# Patient Record
Sex: Female | Born: 2000 | Race: White | Hispanic: No | Marital: Single | Smoking: Never smoker
Health system: Southern US, Community
[De-identification: ages and names within clinical notes are randomized; demographics above are authoritative.]

## PROBLEM LIST (undated history)

## (undated) DIAGNOSIS — F419 Anxiety disorder, unspecified: Secondary | ICD-10-CM

## (undated) DIAGNOSIS — N946 Dysmenorrhea, unspecified: Secondary | ICD-10-CM

## (undated) DIAGNOSIS — G43909 Migraine, unspecified, not intractable, without status migrainosus: Secondary | ICD-10-CM

## (undated) DIAGNOSIS — F32A Depression, unspecified: Secondary | ICD-10-CM

## (undated) DIAGNOSIS — Z23 Encounter for immunization: Secondary | ICD-10-CM

## (undated) DIAGNOSIS — F329 Major depressive disorder, single episode, unspecified: Secondary | ICD-10-CM

## (undated) DIAGNOSIS — R102 Pelvic and perineal pain: Secondary | ICD-10-CM

## (undated) DIAGNOSIS — K2970 Gastritis, unspecified, without bleeding: Secondary | ICD-10-CM

## (undated) HISTORY — DX: Encounter for immunization: Z23

## (undated) HISTORY — PX: CHOLECYSTECTOMY: SHX55

## (undated) HISTORY — DX: Gastritis, unspecified, without bleeding: K29.70

## (undated) HISTORY — DX: Pelvic and perineal pain: R10.2

## (undated) HISTORY — DX: Dysmenorrhea, unspecified: N94.6

---
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
## 2019-03-07 ENCOUNTER — Ambulatory Visit: Payer: Medicaid Other

## 2019-03-07 ENCOUNTER — Other Ambulatory Visit: Payer: Self-pay

## 2019-03-07 DIAGNOSIS — Z3202 Encounter for pregnancy test, result negative: Secondary | ICD-10-CM | POA: Diagnosis not present

## 2019-03-07 DIAGNOSIS — Z3042 Encounter for surveillance of injectable contraceptive: Secondary | ICD-10-CM

## 2019-03-07 LAB — POCT URINE PREGNANCY: Preg Test, Ur: NEGATIVE

## 2019-03-07 MED ORDER — MEDROXYPROGESTERONE ACETATE 150 MG/ML IM SUSP
150.0000 mg | Freq: Once | INTRAMUSCULAR | Status: AC
  Administered 2019-03-07: 15:00:00 150 mg via INTRAMUSCULAR

## 2019-05-30 ENCOUNTER — Other Ambulatory Visit: Payer: Self-pay

## 2019-05-30 ENCOUNTER — Ambulatory Visit (INDEPENDENT_AMBULATORY_CARE_PROVIDER_SITE_OTHER): Payer: BC Managed Care – PPO

## 2019-05-30 DIAGNOSIS — Z3042 Encounter for surveillance of injectable contraceptive: Secondary | ICD-10-CM

## 2019-05-30 MED ORDER — MEDROXYPROGESTERONE ACETATE 150 MG/ML IM SUSP
150.0000 mg | Freq: Once | INTRAMUSCULAR | Status: AC
  Administered 2019-05-30: 11:00:00 150 mg via INTRAMUSCULAR

## 2019-08-25 ENCOUNTER — Other Ambulatory Visit: Payer: Self-pay

## 2019-08-25 ENCOUNTER — Encounter: Payer: Self-pay | Admitting: Obstetrics and Gynecology

## 2019-08-25 ENCOUNTER — Ambulatory Visit: Payer: Self-pay | Admitting: Obstetrics and Gynecology

## 2019-08-25 ENCOUNTER — Other Ambulatory Visit (HOSPITAL_COMMUNITY)
Admission: RE | Admit: 2019-08-25 | Discharge: 2019-08-25 | Disposition: A | Discharge status: Home / Self Care | Payer: BC Managed Care – PPO | Source: Ambulatory Visit | Attending: Obstetrics and Gynecology | Admitting: Obstetrics and Gynecology

## 2019-08-25 ENCOUNTER — Ambulatory Visit (INDEPENDENT_AMBULATORY_CARE_PROVIDER_SITE_OTHER): Payer: Medicaid Other | Admitting: Obstetrics and Gynecology

## 2019-08-25 VITALS — BP 130/80 | Ht 61.0 in | Wt 197.0 lb

## 2019-08-25 DIAGNOSIS — Z113 Encounter for screening for infections with a predominantly sexual mode of transmission: Secondary | ICD-10-CM | POA: Insufficient documentation

## 2019-08-25 DIAGNOSIS — N941 Unspecified dyspareunia: Secondary | ICD-10-CM

## 2019-08-25 DIAGNOSIS — Z3042 Encounter for surveillance of injectable contraceptive: Secondary | ICD-10-CM

## 2019-08-25 DIAGNOSIS — Z01419 Encounter for gynecological examination (general) (routine) without abnormal findings: Secondary | ICD-10-CM | POA: Diagnosis not present

## 2019-08-25 DIAGNOSIS — G43019 Migraine without aura, intractable, without status migrainosus: Secondary | ICD-10-CM

## 2019-08-25 LAB — POCT URINE PREGNANCY: Preg Test, Ur: NEGATIVE

## 2019-08-25 MED ORDER — MEDROXYPROGESTERONE ACETATE 150 MG/ML IM SUSY: 150.0000 mg | PREFILLED_SYRINGE | Freq: Once | INTRAMUSCULAR | 3 refills | Status: AC

## 2019-08-25 MED ORDER — MEDROXYPROGESTERONE ACETATE 150 MG/ML IM SUSP
150.0000 mg | Freq: Once | INTRAMUSCULAR | Status: AC
  Administered 2019-08-25: 150 mg via INTRAMUSCULAR

## 2019-08-25 NOTE — Progress Notes (Signed)
PCP:  Galen Manila, NP (Inactive)   Chief Complaint  Patient presents with   Gynecologic Exam   Vaginal Pain    during intercourse     HPI:      [name] is a 19 y.o. G0P0000 who LMP was No LMP recorded. Patient has had an injection., presents today for her annual examination.  Her menses are absent with depo. Has occas BTB when next depo due.  Dysmenorrhea mild. Pt with hx of severe dysmen off BC. (Started depo a couple yrs ago due to hx of migraines. Depo improved cramps but worsened headaches. Pt saw [facility] neuro who suggested she go on OCPs. Pt started on aviane 12/18. Pt stated dysmen improved on OCPs but not as good as depo, but she had increased acne, emotional sx, and wanted non-daily method. Depo restarted 7/19. Headaches being managed by neuro, also treating for triggers, including light sensitivity.  Had migraine improvement with piercing.)  Sex activity: currently sex active, contraception - depo . Has been having pain at vaginal opening with dryness, tearing at post fourchette, and feeling like muscle at opening won't relax, even if using manual stimulation to stretch opening. Sx for a few months. Using a lot of water-based lubricant that doesn't last. Uses dove sens skin soap and dryer sheets. Has newer partner who is larger in size. Is on psych meds but they were changed to improve other sexual side effects. Can use tampons without a problem.  Last Pap: N/A Hx of STDs: none  There is no FH of breast cancer. There is no FH of ovarian cancer. The patient does do self-breast exams.  Tobacco use: The patient denies current or previous tobacco use. Alcohol use: occas No drug use. Exercise: moderately active.   She does get adequate calcium and Vitamin D in her diet. Gardasil completed.   Past Medical History:  Diagnosis Date   Anxiety    Depression    Dysmenorrhea in adolescent    Gastritis    Migraines    Pelvic pain    Vaccine for human  papilloma virus (HPV) types 6, 11, 16, and 18 administered     Past Surgical History:  Procedure Laterality Date   CHOLECYSTECTOMY      Family History  Problem Relation Age of Onset   Hypertension Mother    Diabetes Mother        prediabetic   Heart failure Father    Endometriosis Maternal Grandmother    Migraines Paternal Grandmother    Breast cancer Neg Hx    Ovarian cancer Neg Hx     Social History   Socioeconomic History   Marital status: Single    Spouse name: Not on file   Number of children: Not on file   Years of education: Not on file   Highest education level: Not on file  Occupational History   Not on file  Tobacco Use   Smoking status: Never Smoker   Smokeless tobacco: Never Used  Vaping Use   Vaping Use: Never used  Substance and Sexual Activity   Alcohol use: No   Drug use: Yes    Types: Marijuana   Sexual activity: Yes    Birth control/protection: Injection  Other Topics Concern   Not on file  Social History Narrative   Not on file   Social Determinants of Health   Financial Resource Strain:    Difficulty of Paying Living Expenses:   Food Insecurity:    Worried  About Running Out of Food in the Last Year:    Ran Out of Food in the Last Year:   Transportation Needs:    Freight forwarder (Medical):    Lack of Transportation (Non-Medical):   Physical Activity:    Days of Exercise per Week:    Minutes of Exercise per Session:   Stress:    Feeling of Stress :   Social Connections:    Frequency of Communication with Friends and Family:    Frequency of Social Gatherings with Friends and Family:    Attends Religious Services:    Active Member of Clubs or Organizations:    Attends Banker Meetings:    Marital Status:   Intimate Partner Violence:    Fear of Current or Ex-Partner:    Emotionally Abused:    Physically Abused:    Sexually Abused:     Outpatient Medications Prior to  Visit  Medication Sig Dispense Refill   acetaminophen (TYLENOL) 325 MG tablet Take 650 mg by mouth every 6 (six) hours as needed for mild pain or moderate pain.     clonazePAM (KLONOPIN) 0.25 MG disintegrating tablet Take by mouth.     ibuprofen (ADVIL,MOTRIN) 200 MG tablet Take 200 mg by mouth every 6 (six) hours as needed for mild pain or moderate pain.     LamoTRIgine 100 MG TB24 24 hour tablet Take by mouth.     LORazepam (ATIVAN) 1 MG tablet TK 1/2 TO 1 T PO TO ABORT PANIC ATTACK     QUEtiapine (SEROQUEL) 25 MG tablet Take by mouth.     Rimegepant Sulfate 75 MG TBDP Take by mouth.     SUMAtriptan (IMITREX) 50 MG tablet Take 1 tablet (50 mg total) by mouth once as needed for up to 1 dose for migraine. May repeat one dose in 2 hours if headache persists, for max dose 24 hours 12 tablet 2   Galcanezumab-gnlm (EMGALITY) 120 MG/ML SOAJ Inject into the skin. (Patient not taking: Reported on 08/25/2019)     medroxyPROGESTERone Acetate 150 MG/ML SUSY Inject 1 mL (150 mg total) into the muscle once for 1 dose. 1 mL 3   meloxicam (MOBIC) 7.5 MG tablet Take 7.5 mg by mouth daily.     omeprazole (PRILOSEC) 20 MG capsule Take by mouth.     No facility-administered medications prior to visit.      ROS:  Review of Systems  Constitutional: Negative for fatigue, fever and unexpected weight change.  Respiratory: Negative for cough, shortness of breath and wheezing.   Cardiovascular: Negative for chest pain, palpitations and leg swelling.  Gastrointestinal: Negative for blood in stool, constipation, diarrhea, nausea and vomiting.  Endocrine: Negative for cold intolerance, heat intolerance and polyuria.  Genitourinary: Positive for dyspareunia. Negative for dysuria, flank pain, frequency, genital sores, hematuria, menstrual problem, pelvic pain, urgency, vaginal bleeding, vaginal discharge and vaginal pain.  Musculoskeletal: Negative for back pain, joint swelling and myalgias.  Skin:  Negative for rash.  Neurological: Positive for headaches. Negative for dizziness, syncope, light-headedness and numbness.  Hematological: Negative for adenopathy.  Psychiatric/Behavioral: Negative for agitation, confusion, dysphoric mood, sleep disturbance and suicidal ideas. The patient is not nervous/anxious.   BREAST: No symptoms   Objective: BP 130/80    Ht 5\' 1"  (1.549 m)    Wt 197 lb (89.4 kg)    BMI 37.22 kg/m    Physical Exam Constitutional:      Appearance: She is well-developed.  Genitourinary:     Vulva,  vagina, cervix, uterus, right adnexa and left adnexa normal.     No vulval lesion or tenderness noted.     No vaginal discharge, erythema or tenderness.     No cervical polyp.     Uterus is not enlarged or tender.     No right or left adnexal mass present.     Right adnexa not tender.     Left adnexa not tender.     Genitourinary Comments: VAGINAL OPENING IS WNL, ABLE TO INSERT SPECULUM AND DO BIMANUAL EXAM EASILY; NO ERYTHEMA  Neck:     Thyroid: No thyromegaly.  Cardiovascular:     Rate and Rhythm: Normal rate and regular rhythm.     Heart sounds: Normal heart sounds. No murmur heard.   Pulmonary:     Effort: Pulmonary effort is normal.     Breath sounds: Normal breath sounds.  Chest:     Breasts:        Right: No mass, nipple discharge, skin change or tenderness.        Left: No mass, nipple discharge, skin change or tenderness.  Abdominal:     Palpations: Abdomen is soft.     Tenderness: There is no abdominal tenderness. There is no guarding.  Musculoskeletal:        General: Normal range of motion.     Cervical back: Normal range of motion.  Neurological:     General: No focal deficit present.     Mental Status: She is alert and oriented to person, place, and time.     Cranial Nerves: No cranial nerve deficit.  Skin:    General: Skin is warm and dry.  Psychiatric:        Mood and Affect: Mood normal.        Behavior: Behavior normal.        Thought  Content: Thought content normal.        Judgment: Judgment normal.  Vitals reviewed.     Assessment/Plan: Encounter for annual routine gynecological examination  Screening for STD (sexually transmitted disease) - Plan: Cervicovaginal ancillary only  Encounter for surveillance of injectable contraceptive - Plan: POCT urine pregnancy, medroxyPROGESTERone (DEPO-PROVERA) injection 150 mg; Depo RF eRxd. Cont ca/Vit D.  Dyspareunia in female--neg exam. Try coconut oil as lubricant. 1 sample prem vag crm to use ext QHS for 1-2 wks in case related to depo use. Line dry underwear.  F/u prn.   Intractable migraine without aura and without status migrainosus--followed by neuro   Meds ordered this encounter  Medications   medroxyPROGESTERone (DEPO-PROVERA) injection 150 mg   medroxyPROGESTERone Acetate 150 MG/ML SUSY    Sig: Inject 1 mL (150 mg total) into the muscle once for 1 dose.    Dispense:  1 mL    Refill:  3    Order Specific Question:   Supervising Provider    Answer:   Nadara Mustard [[id number]]             GYN counsel STD prevention, adequate intake of calcium and vitamin D, diet and exercise     F/U  Return in about 1 year (around 08/24/2020) for annual.  Freya Zobrist B. Vignesh Willert, PA-C 08/25/2019 4:29 PM

## 2019-08-25 NOTE — Patient Instructions (Signed)
I value your feedback and entrusting us with your care. If you get a  patient survey, I would appreciate you taking the time to let us know about your experience today. Thank you!  As of January 02, 2019, your lab results will be released to your MyChart immediately, before I even have a chance to see them. Please give me time to review them and contact you if there are any abnormalities. Thank you for your patience.  

## 2019-08-27 LAB — CERVICOVAGINAL ANCILLARY ONLY
Chlamydia: NEGATIVE
Comment: NEGATIVE
Comment: NORMAL
Neisseria Gonorrhea: NEGATIVE

## 2019-11-15 ENCOUNTER — Other Ambulatory Visit: Payer: Self-pay | Admitting: Obstetrics and Gynecology

## 2019-11-15 DIAGNOSIS — Z3042 Encounter for surveillance of injectable contraceptive: Secondary | ICD-10-CM

## 2019-11-20 ENCOUNTER — Ambulatory Visit (INDEPENDENT_AMBULATORY_CARE_PROVIDER_SITE_OTHER): Payer: BC Managed Care – PPO

## 2019-11-20 ENCOUNTER — Other Ambulatory Visit: Payer: Self-pay

## 2019-11-20 DIAGNOSIS — N946 Dysmenorrhea, unspecified: Secondary | ICD-10-CM | POA: Diagnosis not present

## 2019-11-20 MED ORDER — MEDROXYPROGESTERONE ACETATE 150 MG/ML IM SUSP
150.0000 mg | Freq: Once | INTRAMUSCULAR | Status: AC
  Administered 2019-11-20: 150 mg via INTRAMUSCULAR

## 2019-11-21 ENCOUNTER — Ambulatory Visit: Payer: Self-pay

## 2019-11-24 ENCOUNTER — Ambulatory Visit: Payer: Self-pay

## 2019-12-20 IMAGING — DX DG CHEST 2V
2 series · 2 of 2 positions shown · non-contrast
Comparison: None.

CLINICAL DATA: Cough for 1 month with fever and chills

EXAM:
CHEST - 2 VIEW

[chest pa]
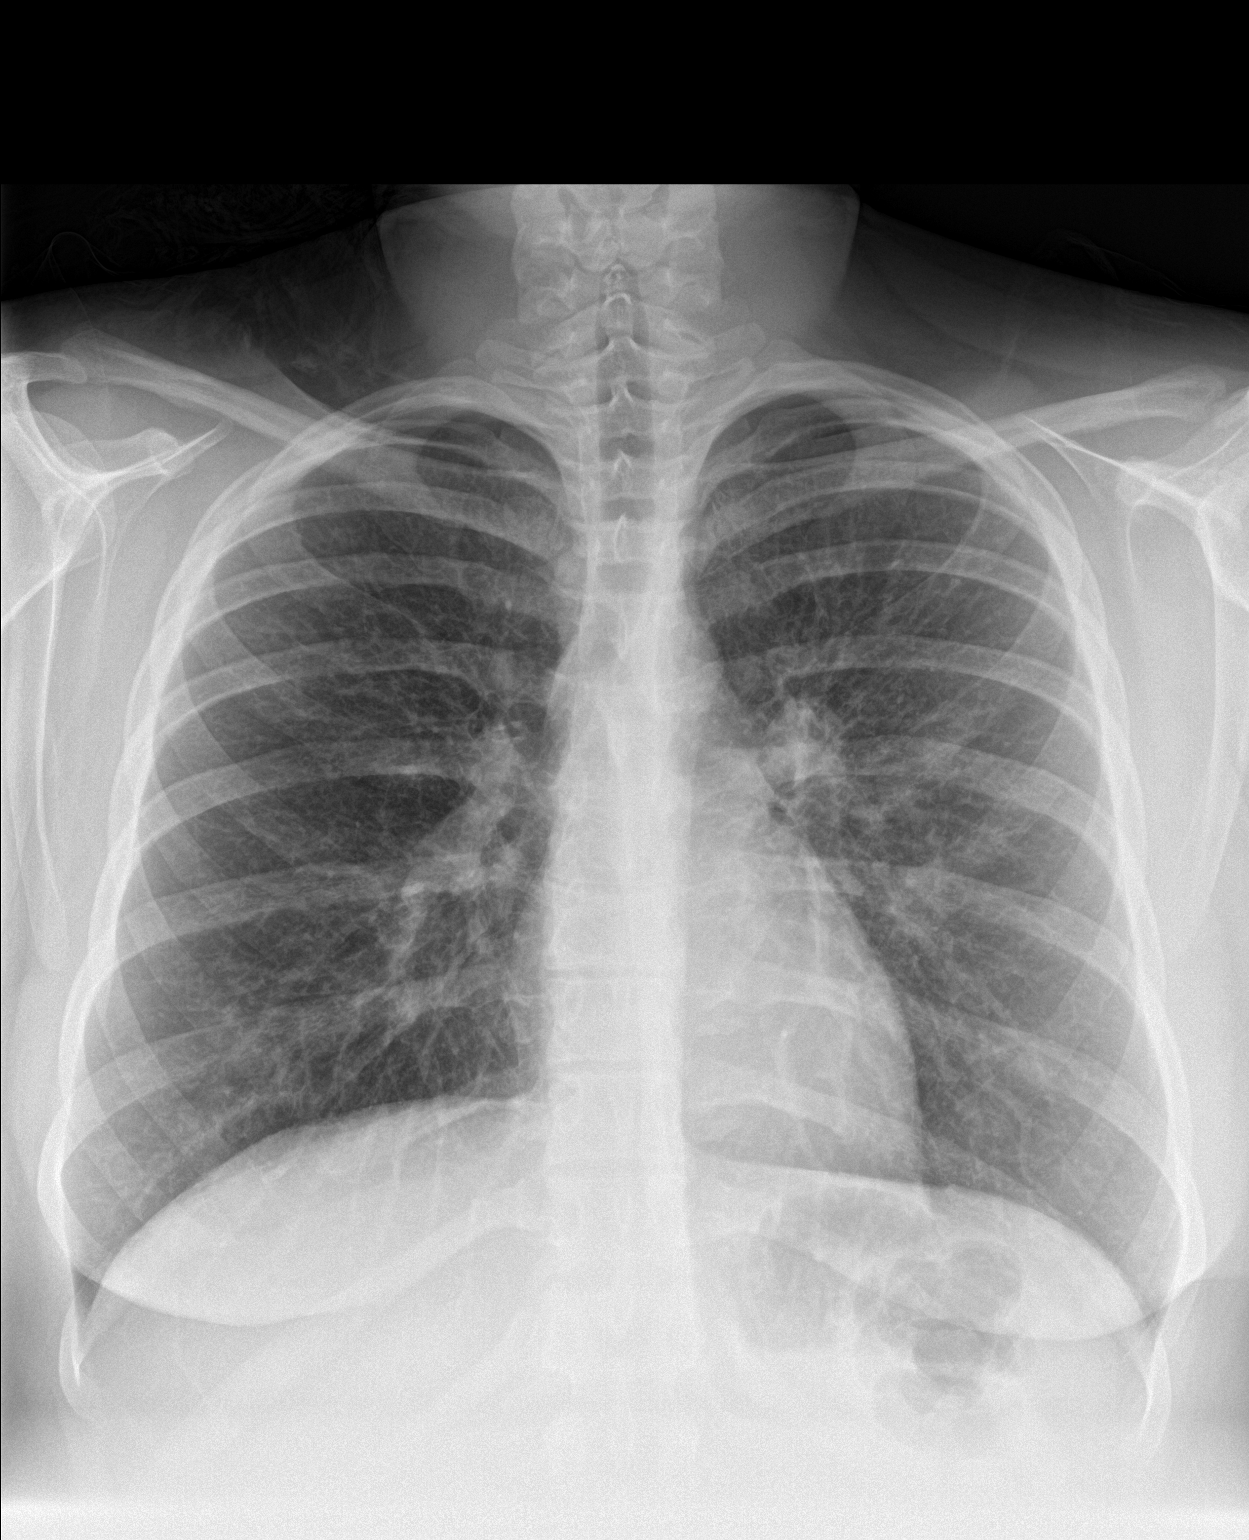

[chest lat]
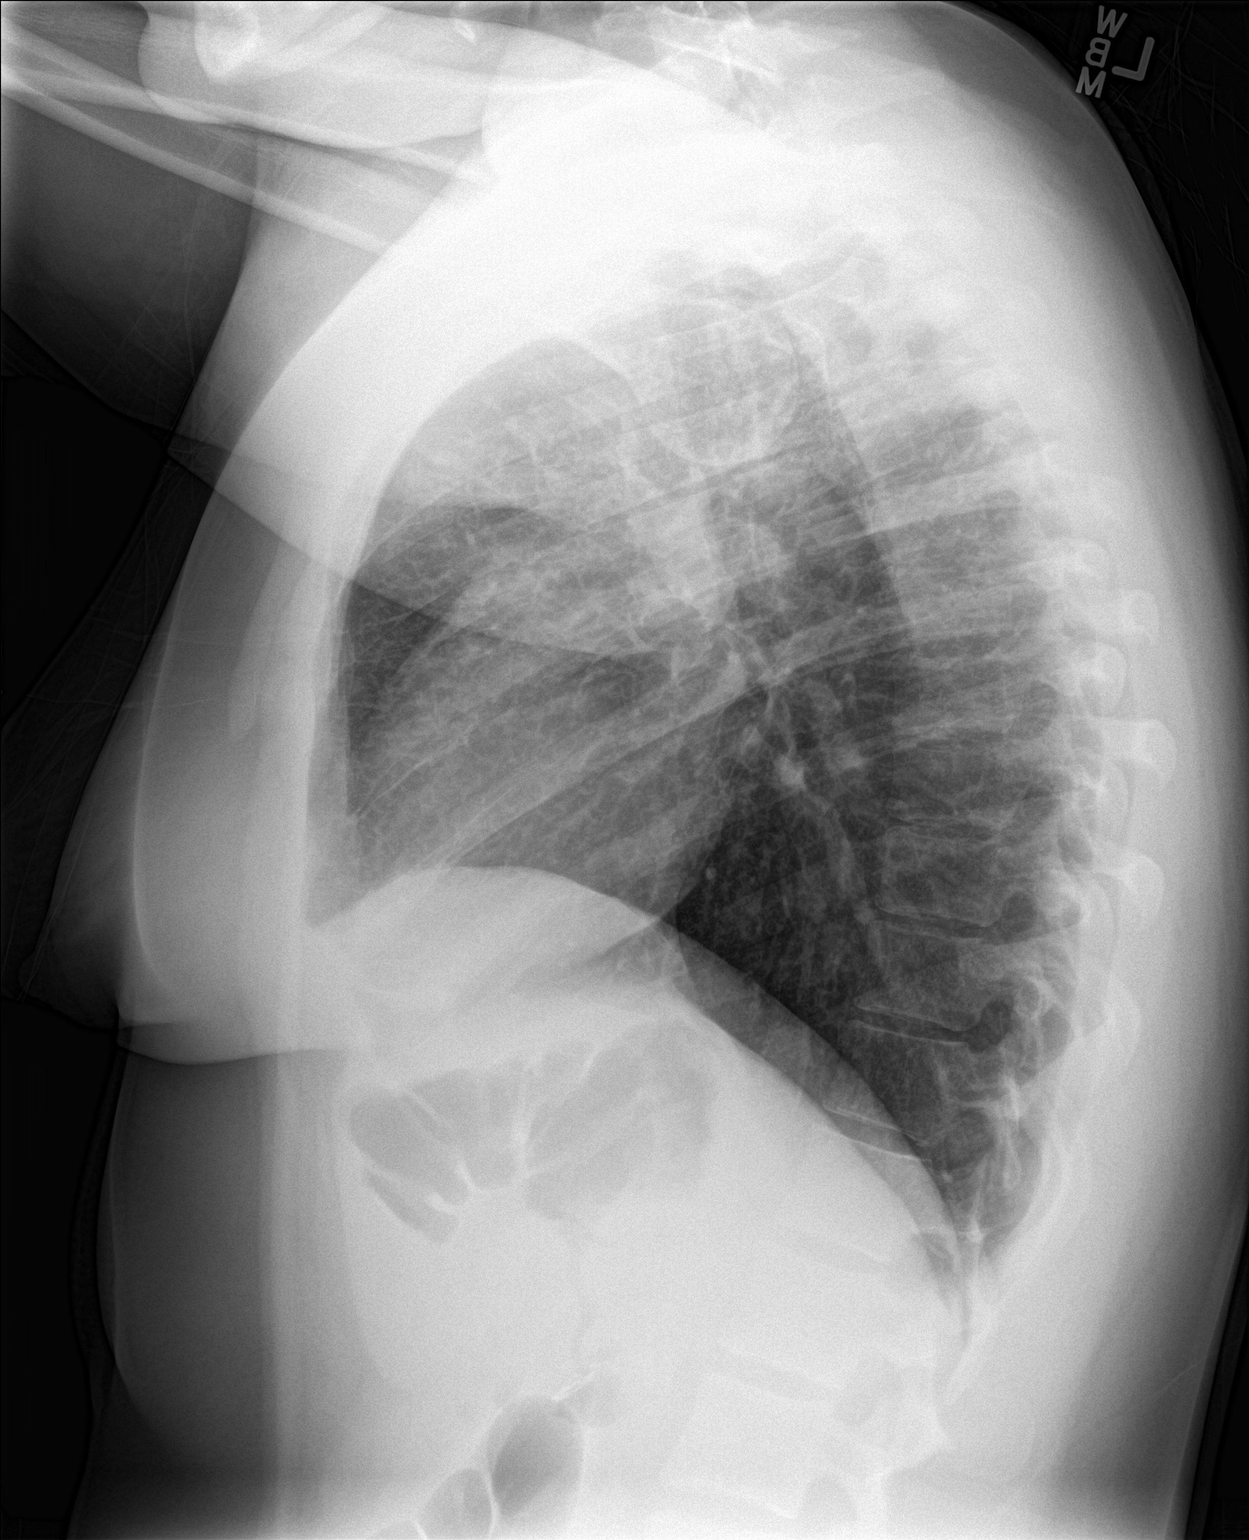

[2 of 2 positions shown; findings below may reference images not displayed]

FINDINGS: Asymmetric subtle increased markings about the left hilum. No
consolidation, edema, effusion, or pneumothorax. Normal heart size
and mediastinal contours.
IMPRESSION: Equivocal for left perihilar bronchopneumonia. Given the history,
note that atypical infection could have this subtle appearance.

## 2020-02-12 ENCOUNTER — Ambulatory Visit: Payer: BC Managed Care – PPO

## 2020-02-16 ENCOUNTER — Telehealth: Payer: Self-pay

## 2020-02-16 ENCOUNTER — Ambulatory Visit: Payer: BC Managed Care – PPO

## 2020-02-16 NOTE — Telephone Encounter (Signed)
Pt calling regarding depo.  [phone]  LMTC - msg muffled.

## 2020-02-23 NOTE — Telephone Encounter (Signed)
Left second msg to call.

## 2020-02-25 NOTE — Telephone Encounter (Signed)
Pt hasn't returned call.  Msg closed. 

## 2020-04-05 ENCOUNTER — Other Ambulatory Visit: Payer: Self-pay

## 2020-04-05 ENCOUNTER — Encounter (HOSPITAL_COMMUNITY): Payer: Self-pay

## 2020-04-05 ENCOUNTER — Ambulatory Visit (HOSPITAL_COMMUNITY)
Admission: EM | Admit: 2020-04-05 | Discharge: 2020-04-05 | Disposition: A | Discharge status: Home / Self Care | Payer: BC Managed Care – PPO | Attending: Physician Assistant | Admitting: Physician Assistant

## 2020-04-05 DIAGNOSIS — Z760 Encounter for issue of repeat prescription: Secondary | ICD-10-CM

## 2020-04-05 MED ORDER — LAMOTRIGINE 100 MG PO TABS: 100.0000 mg | ORAL_TABLET | Freq: Every day | ORAL | 0 refills | Status: AC

## 2020-04-05 MED ORDER — QUETIAPINE FUMARATE 25 MG PO TABS
25.0000 mg | ORAL_TABLET | Freq: Every day | ORAL | 0 refills | Status: AC
Start: 2020-04-05 — End: 2021-11-18

## 2020-04-05 MED ORDER — LAMOTRIGINE 200 MG PO TABS: 200.0000 mg | ORAL_TABLET | Freq: Every day | ORAL | 0 refills | Status: AC

## 2020-04-05 NOTE — Discharge Instructions (Signed)
Follow up with Behavioral health

## 2020-04-05 NOTE — ED Provider Notes (Signed)
[facility]    CSN: [id number] Arrival date & time: 04/05/20  1829      History   Chief Complaint Chief Complaint  Patient presents with  . Medication Refill    HPI Erin Joseph is a 20 y.o. female.   Pt presents for medication refill of lamotrigine, Seroquel, klonopin.  Reports she went to get her prescriptions and the pharmacy had difficulty refilling, stating there were no refills available.  She has reached out to prescribing provider and has been unable to reach them.  Pharmacy reports they haven't been able to get in touch with prescribing provider either.  She has been out of her Seroquel for 3 days.  Pt reports she is currently trying to establish care with a new behavioral health provider.      Past Medical History:  Diagnosis Date  . Anxiety   . Depression   . Dysmenorrhea in adolescent   . Gastritis   . Migraines   . Pelvic pain   . Vaccine for human papilloma virus (HPV) types 6, 11, 16, and 18 administered     Patient Active Problem List   Diagnosis Date Noted  . Dyspareunia in female 08/25/2019  . Migraines 07/31/2017  . Intractable migraine with aura without status migrainosus 09/19/2016  . Dysmenorrhea in the adolescent 09/19/2016  . Pelvic pain 09/19/2016    Past Surgical History:  Procedure Laterality Date  . CHOLECYSTECTOMY      OB History    Gravida  0   Para  0   Term  0   Preterm  0   AB  0   Living  0     SAB  0   IAB  0   Ectopic  0   Multiple  0   Live Births  0            Home Medications    Prior to Admission medications   Medication Sig Start Date End Date Taking? Authorizing Provider  lamoTRIgine (LAMICTAL) 100 MG tablet Take 1 tablet (100 mg total) by mouth daily. Take along with 200mg  tablet daily for a total of 300mg  04/05/20 05/05/20 Yes Tonianne Fine, 04/07/20, PA-C  lamoTRIgine (LAMICTAL) 200 MG tablet Take 1 tablet (200 mg total) by mouth daily. Take along with 100mg  tablet daily for a total of  300mg  04/05/20 05/05/20 Yes Divine Hansley, , PA-C  QUEtiapine (SEROQUEL) 25 MG tablet Take 1 tablet (25 mg total) by mouth at bedtime. 04/05/20 05/05/20 Yes My Madariaga, 05/07/20, PA-C  acetaminophen (TYLENOL) 325 MG tablet Take 650 mg by mouth every 6 (six) hours as needed for mild pain or moderate pain.    [provider]  clonazePAM (KLONOPIN) 0.25 MG disintegrating tablet Take by mouth.    [provider]  Galcanezumab-gnlm (EMGALITY) 120 MG/ML SOAJ Inject into the skin. Patient not taking: Reported on 08/25/2019 03/05/19   [provider]  ibuprofen (ADVIL,MOTRIN) 200 MG tablet Take 200 mg by mouth every 6 (six) hours as needed for mild pain or moderate pain.    [provider]  LamoTRIgine 100 MG TB24 24 hour tablet Take by mouth. 09/24/18   [provider]  LORazepam (ATIVAN) 1 MG tablet TK 1/2 TO 1 T PO TO ABORT PANIC ATTACK 10/02/18   [provider]  medroxyPROGESTERone Acetate 150 MG/ML SUSY Inject 1 mL (150 mg total) into the muscle once for 1 dose. 08/25/19 08/25/19  Copland, 11/24/18, PA-C  QUEtiapine (SEROQUEL) 25 MG tablet Take by mouth.  [provider]  Rimegepant Sulfate 75 MG TBDP Take by mouth. 08/04/19   [provider]  SUMAtriptan (IMITREX) 50 MG tablet Take 1 tablet (50 mg total) by mouth once as needed for up to 1 dose for migraine. May repeat one dose in 2 hours if headache persists, for max dose 24 hours 07/15/18   Karamalegos, Netta Neat, DO    Family History Family History  Problem Relation Age of Onset  . Hypertension Mother   . Diabetes Mother        prediabetic  . Heart failure Father   . Endometriosis Maternal Grandmother   . Migraines Paternal Grandmother   . Breast cancer Neg Hx   . Ovarian cancer Neg Hx     Social History Social History   Tobacco Use  . Smoking status: Never Smoker  . Smokeless tobacco: Never Used  Vaping Use  . Vaping Use: Never used  Substance Use Topics  . Alcohol use:  No  . Drug use: Yes    Types: Marijuana     Allergies   Patient has no known allergies.   Review of Systems Review of Systems  Constitutional: Negative for chills and fever.  HENT: Negative for ear pain and sore throat.   Eyes: Negative for pain and visual disturbance.  Respiratory: Negative for cough and shortness of breath.   Cardiovascular: Negative for chest pain and palpitations.  Gastrointestinal: Negative for abdominal pain and vomiting.  Genitourinary: Negative for dysuria and hematuria.  Musculoskeletal: Negative for arthralgias and back pain.  Skin: Negative for color change and rash.  Neurological: Negative for seizures and syncope.  All other systems reviewed and are negative.    Physical Exam Triage Vital Signs ED Triage Vitals  Enc Vitals Group     BP 04/05/20 1903 125/65     Pulse Rate 04/05/20 1903 87     Resp 04/05/20 1903 18     Temp 04/05/20 1903 99.3 F (37.4 C)     Temp Source 04/05/20 1903 Oral     SpO2 04/05/20 1903 99 %     Weight --      Height --      Head Circumference --      Peak Flow --      Pain Score 04/05/20 1901 0     Pain Loc --      Pain Edu? --      Excl. in GC? --    No data found.  Updated Vital Signs BP 125/65 (BP Location: Right Arm)   Pulse 87   Temp 99.3 F (37.4 C) (Oral)   Resp 18   SpO2 99%   Visual Acuity Right Eye Distance:   Left Eye Distance:   Bilateral Distance:    Right Eye Near:   Left Eye Near:    Bilateral Near:     Physical Exam Vitals and nursing note reviewed.  Constitutional:      General: She is not in acute distress.    Appearance: She is well-developed.  HENT:     Head: Normocephalic and atraumatic.  Eyes:     Conjunctiva/sclera: Conjunctivae normal.  Cardiovascular:     Rate and Rhythm: Normal rate and regular rhythm.     Heart sounds: No murmur heard.   Pulmonary:     Effort: Pulmonary effort is normal. No respiratory distress.     Breath sounds: Normal breath sounds.   Abdominal:     Palpations: Abdomen is soft.  Tenderness: There is no abdominal tenderness.  Musculoskeletal:     Cervical back: Neck supple.  Skin:    General: Skin is warm and dry.  Neurological:     Mental Status: She is alert.      UC Treatments / Results  Labs (all labs ordered are listed, but only abnormal results are displayed) Labs Reviewed - No data to display  EKG   Radiology No results found.  Procedures Procedures (including critical care time)  Medications Ordered in UC Medications - No data to display  Initial Impression / Assessment and Plan / UC Course  I have reviewed the triage vital signs and the nursing notes.  Pertinent labs & imaging results that were available during my care of the patient were reviewed by me and considered in my medical decision making (see chart for details).     Will provide one refill of Seroquel and Lamotrigine.  Will not refill Klonopin at this time, last filled 03/19/2020 per PDMP. Information given for [facility].  Discussed the importance of establishing care with a new provider for future refills.  Final Clinical Impressions(s) / UC Diagnoses   Final diagnoses:  Medication refill     Discharge Instructions     Follow up with Behavioral health    ED Prescriptions    Medication Sig Dispense Auth. Provider   lamoTRIgine (LAMICTAL) 100 MG tablet Take 1 tablet (100 mg total) by mouth daily. Take along with 200mg  tablet daily for a total of 300mg  30 tablet Eino Whitner, PA-C   lamoTRIgine (LAMICTAL) 200 MG tablet Take 1 tablet (200 mg total) by mouth daily. Take along with 100mg  tablet daily for a total of 300mg  30 tablet Davell Beckstead, PA-C   QUEtiapine (SEROQUEL) 25 MG tablet Take 1 tablet (25 mg total) by mouth at bedtime. 30 tablet , PA-C     PDMP not reviewed this encounter.   , PA-C 04/05/20 1945

## 2020-04-05 NOTE — ED Triage Notes (Signed)
Pt presents for medication refill for all over her medications: pt states she has been unable to reach her doctor and is searching for a new doctor.

## 2020-11-20 ENCOUNTER — Other Ambulatory Visit: Payer: Self-pay | Admitting: Obstetrics and Gynecology

## 2020-11-20 DIAGNOSIS — Z3042 Encounter for surveillance of injectable contraceptive: Secondary | ICD-10-CM

## 2020-11-29 ENCOUNTER — Encounter (HOSPITAL_COMMUNITY): Payer: Self-pay | Admitting: Emergency Medicine

## 2020-11-29 ENCOUNTER — Ambulatory Visit (HOSPITAL_COMMUNITY)
Admission: RE | Admit: 2020-11-29 | Discharge: 2020-11-29 | Disposition: A | Discharge status: Home / Self Care | Payer: BC Managed Care – PPO | Source: Ambulatory Visit | Attending: Emergency Medicine | Admitting: Emergency Medicine

## 2020-11-29 ENCOUNTER — Ambulatory Visit (HOSPITAL_COMMUNITY)
Admission: EM | Admit: 2020-11-29 | Discharge: 2020-11-29 | Disposition: A | Discharge status: Home / Self Care | Payer: BC Managed Care – PPO | Attending: Emergency Medicine | Admitting: Emergency Medicine

## 2020-11-29 ENCOUNTER — Other Ambulatory Visit: Payer: Self-pay

## 2020-11-29 DIAGNOSIS — B349 Viral infection, unspecified: Secondary | ICD-10-CM | POA: Diagnosis not present

## 2020-11-29 DIAGNOSIS — U071 COVID-19: Secondary | ICD-10-CM | POA: Insufficient documentation

## 2020-11-29 DIAGNOSIS — Z20822 Contact with and (suspected) exposure to covid-19: Secondary | ICD-10-CM | POA: Insufficient documentation

## 2020-11-29 DIAGNOSIS — R059 Cough, unspecified: Secondary | ICD-10-CM | POA: Insufficient documentation

## 2020-11-29 LAB — SARS CORONAVIRUS 2 (TAT 6-24 HRS): SARS Coronavirus 2: POSITIVE — AB

## 2020-11-29 MED ORDER — PREDNISONE 20 MG PO TABS: 40.0000 mg | ORAL_TABLET | Freq: Every day | ORAL | 0 refills | Status: AC

## 2020-11-29 MED ORDER — ONDANSETRON 4 MG PO TBDP: 4.0000 mg | ORAL_TABLET | Freq: Three times a day (TID) | ORAL | 0 refills | Status: DC | PRN

## 2020-11-29 MED ORDER — ONDANSETRON 4 MG PO TBDP
ORAL_TABLET | ORAL | Status: AC
  Filled 2020-11-29: qty 1

## 2020-11-29 MED ORDER — AEROCHAMBER PLUS MISC: 2 refills | Status: AC

## 2020-11-29 MED ORDER — DOXYCYCLINE HYCLATE 100 MG PO CAPS: 100.0000 mg | ORAL_CAPSULE | Freq: Two times a day (BID) | ORAL | 0 refills | Status: AC

## 2020-11-29 MED ORDER — PROMETHAZINE-DM 6.25-15 MG/5ML PO SYRP: 5.0000 mL | ORAL_SOLUTION | Freq: Four times a day (QID) | ORAL | 0 refills | Status: DC | PRN

## 2020-11-29 MED ORDER — ONDANSETRON 4 MG PO TBDP
4.0000 mg | ORAL_TABLET | Freq: Once | ORAL | Status: AC
  Administered 2020-11-29: 4 mg via ORAL

## 2020-11-29 MED ORDER — ALBUTEROL SULFATE HFA 108 (90 BASE) MCG/ACT IN AERS: 1.0000 | INHALATION_SPRAY | RESPIRATORY_TRACT | 0 refills | Status: AC | PRN

## 2020-11-29 NOTE — ED Provider Notes (Signed)
HPI  SUBJECTIVE:  Erin Joseph is a 20 y.o. female who presents with 3 days of nasal congestion, chest congestion, yellow rhinorrhea, headache, cough accompanied with nausea posttussive emesis, sore throat, postnasal drip, sinus pressure, upper dental pain.  She reports a cough productive of the same material as her nasal congestion, wheezing, chest soreness secondary to the cough, shortness of breath, dyspnea on exertion, diarrhea and abdominal pain.  She was exposed to COVID 3 days ago.  No flu or RSV exposure.  No fevers above 100.4, body aches, facial swelling.  No antibiotics in the past 3 months.  No antipyretic in the past 6 hours.  She states that she is unable to sleep at night secondary to the cough.  She got the COVID booster and the flu vaccine.  She has a past medical history of pneumonia and asthmatic bronchitis, vapes, COVID in October 2020.  She has no other medical problems.  LMP: 3 weeks ago.  LNL:[id number], Alison Stalling, NP (Inactive)   Past Medical History:  Diagnosis Date   Anxiety    Depression    Dysmenorrhea in adolescent    Gastritis    Migraines    Pelvic pain    Vaccine for human papilloma virus (HPV) types 6, 11, 16, and 18 administered     Past Surgical History:  Procedure Laterality Date   CHOLECYSTECTOMY      Family History  Problem Relation Age of Onset   Hypertension Mother    Diabetes Mother        prediabetic   Heart failure Father    Endometriosis Maternal Grandmother    Migraines Paternal Grandmother    Breast cancer Neg Hx    Ovarian cancer Neg Hx     Social History   Tobacco Use   Smoking status: Never   Smokeless tobacco: Never  Vaping Use   Vaping Use: Never used  Substance Use Topics   Alcohol use: No   Drug use: Yes    Types: Marijuana    No current facility-administered medications for this encounter.  Current Outpatient Medications:    albuterol (VENTOLIN HFA) 108 (90 Base) MCG/ACT inhaler, Inhale 1-2 puffs into the  lungs every 4 (four) hours as needed for wheezing or shortness of breath., Disp: 1 each, Rfl: 0   doxycycline (VIBRAMYCIN) 100 MG capsule, Take 1 capsule (100 mg total) by mouth 2 (two) times daily for 10 days., Disp: 20 capsule, Rfl: 0   ondansetron (ZOFRAN ODT) 4 MG disintegrating tablet, Take 1 tablet (4 mg total) by mouth every 8 (eight) hours as needed for nausea or vomiting., Disp: 20 tablet, Rfl: 0   predniSONE (DELTASONE) 20 MG tablet, Take 2 tablets (40 mg total) by mouth daily with breakfast for 5 days., Disp: 10 tablet, Rfl: 0   promethazine-dextromethorphan (PROMETHAZINE-DM) 6.25-15 MG/5ML syrup, Take 5 mLs by mouth 4 (four) times daily as needed for cough., Disp: 118 mL, Rfl: 0   Spacer/Aero-Holding Chambers (AEROCHAMBER PLUS) inhaler, Use with inhaler, Disp: 1 each, Rfl: 2   acetaminophen (TYLENOL) 325 MG tablet, Take 650 mg by mouth every 6 (six) hours as needed for mild pain or moderate pain., Disp: , Rfl:    clonazePAM (KLONOPIN) 0.25 MG disintegrating tablet, Take by mouth., Disp: , Rfl:    Galcanezumab-gnlm (EMGALITY) 120 MG/ML SOAJ, Inject into the skin. (Patient not taking: Reported on 08/25/2019), Disp: , Rfl:    lamoTRIgine (LAMICTAL) 100 MG tablet, Take 1 tablet (100 mg total) by mouth daily. Take along  with 200mg  tablet daily for a total of 300mg , Disp: 30 tablet, Rfl: 0   lamoTRIgine (LAMICTAL) 200 MG tablet, Take 1 tablet (200 mg total) by mouth daily. Take along with 100mg  tablet daily for a total of 300mg , Disp: 30 tablet, Rfl: 0   LORazepam (ATIVAN) 1 MG tablet, TK 1/2 TO 1 T PO TO ABORT PANIC ATTACK, Disp: , Rfl:    medroxyPROGESTERone Acetate 150 MG/ML SUSY, Inject 1 mL (150 mg total) into the muscle once for 1 dose., Disp: 1 mL, Rfl: 3   QUEtiapine (SEROQUEL) 25 MG tablet, Take 1 tablet (25 mg total) by mouth at bedtime., Disp: 30 tablet, Rfl: 0   Rimegepant Sulfate 75 MG TBDP, Take by mouth., Disp: , Rfl:    SUMAtriptan (IMITREX) 50 MG tablet, Take 1 tablet (50 mg  total) by mouth once as needed for up to 1 dose for migraine. May repeat one dose in 2 hours if headache persists, for max dose 24 hours, Disp: 12 tablet, Rfl: 2  No Known Allergies   ROS  As noted in HPI.   Physical Exam  BP 115/79 (BP Location: Right Arm)   Pulse 94   Temp 99.6 F (37.6 C)   Resp 18   LMP 11/16/2020   SpO2 97%   Constitutional: Well developed, well nourished, no acute distress Eyes: PERRL, EOMI, conjunctiva normal bilaterally HENT: Normocephalic, atraumatic,mucus membranes moist.  Positive nasal congestion.  Normal turbinates.  No maxillary, frontal sinus tenderness.  Postnasal drip, cobblestoning, slightly erythematous oropharynx.  Tonsils normal without exudates.  Uvula midline. Neck: No cervical lymphadenopathy Respiratory: Clear to auscultation bilaterally, no rales, no wheezing, no rhonchi.  Positive anterior chest wall tenderness Cardiovascular: Normal rate and rhythm, no murmurs, no gallops, no rubs GI: Nondistended skin: No rash, skin intact Musculoskeletal: no deformities Neurologic: Alert & oriented x 3, CN III-XII grossly intact, no motor deficits, sensation grossly intact Psychiatric: Speech and behavior appropriate   ED Course   Medications  ondansetron (ZOFRAN-ODT) disintegrating tablet 4 mg (4 mg Oral Given 11/29/20 1040)    Orders Placed This Encounter  Procedures   SARS CORONAVIRUS 2 (TAT 6-24 HRS) Nasopharyngeal Nasopharyngeal Swab    Standing Status:   Standing    Number of Occurrences:   1   DG Chest 2 View    Standing Status:   Future    Number of Occurrences:   1    Standing Expiration Date:   11/29/2021    Order Specific Question:   Reason for Exam (SYMPTOM  OR DIAGNOSIS REQUIRED)    Answer:   cough r/o PNA    Order Specific Question:   Is patient pregnant?    Answer:   No    Order Specific Question:   Preferred imaging location?    Answer:   [facility]   Results for orders placed or performed during the hospital  encounter of 11/29/20 (from the past 24 hour(s))  SARS CORONAVIRUS 2 (TAT 6-24 HRS) Nasopharyngeal Nasopharyngeal Swab     Status: Abnormal   Collection Time: 11/29/20 10:33 AM   Specimen: Nasopharyngeal Swab  Result Value Ref Range   SARS Coronavirus 2 POSITIVE (A) NEGATIVE   DG Chest 2 View  Result Date: 11/29/2020 CLINICAL DATA:  Cough. EXAM: CHEST - 2 VIEW COMPARISON:  10/15/2017 FINDINGS: The lungs are clear without focal pneumonia, edema, pneumothorax or pleural effusion. The cardiopericardial silhouette is within normal limits for size. The visualized bony structures of the thorax show no acute abnormality.  IMPRESSION: No active cardiopulmonary disease. Electronically Signed   By: Kennith Center M.D.   On: 11/29/2020 11:52    ED Clinical Impression  1. COVID-19 virus infection   2. Encounter for laboratory testing for COVID-19 virus      ED Assessment/Plan  Discussed with patient that unfortunately we currently do not have x-ray available at the moment.  Placing future order for her to get 1 done at the hospital.  will contact her if abnormal.  Checking COVID, however, she is fully vaccinated and has no comorbidities, thus does not qualify for antiviral treatment.  Supportive treatment.  Giving Zofran here.  Will send home with an albuterol inhaler with a spacer, 40 mg of prednisone for 5 days, promethazine DM, wait-and-see prescription of doxycycline which will cover both pneumonia and a sinusitis, and a COVID work note.She will fill the doxy if her covid is negative.   COVID positive, CXR negative for PNA. Called pt and discussed labs, imaging with her.   Discussed MDM, treatment plan, and plan for follow-up with patient . patient agrees with plan.   Meds ordered this encounter  Medications   ondansetron (ZOFRAN-ODT) disintegrating tablet 4 mg   doxycycline (VIBRAMYCIN) 100 MG capsule    Sig: Take 1 capsule (100 mg total) by mouth 2 (two) times daily for 10 days.    Dispense:   20 capsule    Refill:  0   albuterol (VENTOLIN HFA) 108 (90 Base) MCG/ACT inhaler    Sig: Inhale 1-2 puffs into the lungs every 4 (four) hours as needed for wheezing or shortness of breath.    Dispense:  1 each    Refill:  0   Spacer/Aero-Holding Chambers (AEROCHAMBER PLUS) inhaler    Sig: Use with inhaler    Dispense:  1 each    Refill:  2    Please educate patient on use   predniSONE (DELTASONE) 20 MG tablet    Sig: Take 2 tablets (40 mg total) by mouth daily with breakfast for 5 days.    Dispense:  10 tablet    Refill:  0   promethazine-dextromethorphan (PROMETHAZINE-DM) 6.25-15 MG/5ML syrup    Sig: Take 5 mLs by mouth 4 (four) times daily as needed for cough.    Dispense:  118 mL    Refill:  0   ondansetron (ZOFRAN ODT) 4 MG disintegrating tablet    Sig: Take 1 tablet (4 mg total) by mouth every 8 (eight) hours as needed for nausea or vomiting.    Dispense:  20 tablet    Refill:  0      *This clinic note was created using Scientist, clinical (histocompatibility and immunogenetics). Therefore, there may be occasional mistakes despite careful proofreading. ?    Domenick Gong, MD 11/29/20 [phone]

## 2020-11-29 NOTE — ED Triage Notes (Signed)
Since Friday having cough, congestion, body aches, SOB. Hx bronchitis or PNA when gets colds.

## 2020-11-29 NOTE — Discharge Instructions (Addendum)
I have ordered an x-ray to be done at the hospital.  I will contact you only if it is abnormal.  We will have the results later today.  I am sorry that we do not have x-ray available here at the moment.  Take the Zofran 3 times a day as needed for nausea.  It may cause constipation.  600 mg of ibuprofen combined with 1000 mg of Tylenol together 3-4 times a day as needed.  Push plenty of electrolyte containing fluids such as Pedialyte and Gatorade.  2 puffs from your albuterol inhaler using your spacer every 4-6 hours as needed for wheezing, shortness of breath.  Finish the prednisone, even if you feel better.  Promethazine DM for the cough.  Your COVID will be back in 6 to 24 hours.  If it is positive, then do not fill the antibiotic.  If it is negative, and you are not getting any better, go ahead and fill the doxycycline.

## 2020-12-03 ENCOUNTER — Other Ambulatory Visit: Payer: Self-pay

## 2020-12-03 ENCOUNTER — Encounter (HOSPITAL_COMMUNITY): Payer: Self-pay | Admitting: Emergency Medicine

## 2020-12-03 ENCOUNTER — Emergency Department (HOSPITAL_COMMUNITY): Payer: BC Managed Care – PPO

## 2020-12-03 ENCOUNTER — Emergency Department (HOSPITAL_COMMUNITY)
Admission: EM | Admit: 2020-12-03 | Discharge: 2020-12-04 | Disposition: A | Discharge status: Home / Self Care | Payer: BC Managed Care – PPO | Attending: Emergency Medicine | Admitting: Emergency Medicine

## 2020-12-03 DIAGNOSIS — R0602 Shortness of breath: Secondary | ICD-10-CM | POA: Diagnosis present

## 2020-12-03 DIAGNOSIS — R0789 Other chest pain: Secondary | ICD-10-CM | POA: Insufficient documentation

## 2020-12-03 DIAGNOSIS — R002 Palpitations: Secondary | ICD-10-CM | POA: Diagnosis not present

## 2020-12-03 DIAGNOSIS — Z5321 Procedure and treatment not carried out due to patient leaving prior to being seen by health care provider: Secondary | ICD-10-CM | POA: Diagnosis not present

## 2020-12-03 DIAGNOSIS — Z8616 Personal history of COVID-19: Secondary | ICD-10-CM | POA: Insufficient documentation

## 2020-12-03 DIAGNOSIS — R079 Chest pain, unspecified: Secondary | ICD-10-CM

## 2020-12-03 LAB — BASIC METABOLIC PANEL
Anion gap: 11 (ref 5–15)
BUN: 24 mg/dL — ABNORMAL HIGH (ref 6–20)
CO2: 22 mmol/L (ref 22–32)
Calcium: 9.4 mg/dL (ref 8.9–10.3)
Chloride: 105 mmol/L (ref 98–111)
Creatinine, Ser: 1.08 mg/dL — ABNORMAL HIGH (ref 0.44–1.00)
GFR, Estimated: >60 mL/min (ref >60)
Glucose, Bld: 89 mg/dL (ref 70–99)
Potassium: 2.8 mmol/L — ABNORMAL LOW (ref 3.5–5.1)
Sodium: 138 mmol/L (ref 135–145)

## 2020-12-03 LAB — CBC WITH DIFFERENTIAL/PLATELET
Abs Immature Granulocytes: 0.18 10*3/uL — ABNORMAL HIGH (ref 0.00–0.07)
Basophils Absolute: 0.1 10*3/uL (ref 0.0–0.1)
Basophils Relative: 1 %
Eosinophils Absolute: 0.1 10*3/uL (ref 0.0–0.5)
Eosinophils Relative: 1 %
HCT: 44.3 % (ref 36.0–46.0)
Hemoglobin: 14.6 g/dL (ref 12.0–15.0)
Immature Granulocytes: 2 %
Lymphocytes Relative: 38 %
Lymphs Abs: 3.1 10*3/uL (ref 0.7–4.0)
MCH: 30.4 pg (ref 26.0–34.0)
MCHC: 33 g/dL (ref 30.0–36.0)
MCV: 92.3 fL (ref 80.0–100.0)
Monocytes Absolute: 0.7 10*3/uL (ref 0.1–1.0)
Monocytes Relative: 9 %
Neutro Abs: 4 10*3/uL (ref 1.7–7.7)
Neutrophils Relative %: 49 %
Platelets: 280 10*3/uL (ref 150–400)
RBC: 4.8 MIL/uL (ref 3.87–5.11)
RDW: 12 % (ref 11.5–15.5)
WBC: 8.1 10*3/uL (ref 4.0–10.5)
nRBC: 0 % (ref 0.0–0.2)

## 2020-12-03 LAB — I-STAT BETA HCG BLOOD, ED (MC, WL, AP ONLY): I-stat hCG, quantitative: <5 m[IU]/mL (ref <5)

## 2020-12-03 LAB — TROPONIN I (HIGH SENSITIVITY): Troponin I (High Sensitivity): 4 ng/L (ref <18)

## 2020-12-03 NOTE — ED Notes (Signed)
Pt is back to the waiting area

## 2020-12-03 NOTE — ED Provider Notes (Signed)
Emergency Medicine Provider Triage Evaluation Note  Erin Joseph , a 20 y.o. female  was evaluated in triage.  Pt complains of heart palpitations for the past 2 to 3 days.  Patient reports that she began feeling ill last Saturday.  She tested positive for COVID the next day.  She has been taking prednisone as well as using albuterol inhaler.  She states that on Wednesday she began feeling her heart beating fast and also felt short of breath.  She went to her student health who thought that it was likely related to inhaler use.  She states that she has been trying to cut back on the inhaler use however continues to have palpitations.  She just finished the prednisone today.  She went back to her student health who advised she come here for further evaluation.  They were concerned she could have a blood clot.  She also mentions family history of CAD with her father in his 77s..  Review of Systems  Positive: + palpitations, SOB Negative: - nausea, vomiting, diaphoresis, leg swelling  Physical Exam  BP 138/89 (BP Location: Left Arm)   Pulse 89   Temp 98.8 F (37.1 C) (Oral)   Resp 18   LMP 11/16/2020   SpO2 98%  Gen:   Awake, no distress   Resp:  Normal effort  MSK:   Moves extremities without difficulty  Other:  RRR  Medical Decision Making  Medically screening exam initiated at 5:26 PM.  Appropriate orders placed.  Erin Joseph was informed that the remainder of the evaluation will be completed by another provider, this initial triage assessment does not replace that evaluation, and the importance of remaining in the ED until their evaluation is complete.  COVID positive. Heart palpitations. RRR here. Hx of CAD - trop added to rule out myocarditis. Suspect palpitations related to prednisone use. D dimer not collected as it will likely be positive s/2 COVID 19 status.    Tanda Rockers, PA-C 12/03/20 1728    Erin Avena, MD 12/03/20 [phone]

## 2020-12-03 NOTE — ED Notes (Signed)
Patient called x2 for room assignment with no response  

## 2020-12-03 NOTE — ED Triage Notes (Signed)
Pt +covid on 11/7, c/o shortness of breath, chest pressure and palpitations x 2 days.

## 2020-12-04 LAB — TSH: TSH: 10.868 u[IU]/mL — ABNORMAL HIGH (ref 0.350–4.500)

## 2020-12-04 LAB — T4, FREE: Free T4: 1.07 ng/dL (ref 0.61–1.12)

## 2020-12-04 LAB — TROPONIN I (HIGH SENSITIVITY): Troponin I (High Sensitivity): <2 ng/L (ref <18)

## 2020-12-04 LAB — D-DIMER, QUANTITATIVE: D-Dimer, Quant: <0.27 ug/mL-FEU (ref 0.00–0.50)

## 2020-12-04 NOTE — ED Notes (Signed)
Patient asks if the doctor can call with results or ill they be on mychart. Explained that the results will be uploaded to [facility] but providers wouldn't call with results. Patient states she is leaving.

## 2020-12-25 ENCOUNTER — Encounter (HOSPITAL_COMMUNITY): Payer: Self-pay | Admitting: *Deleted

## 2020-12-25 ENCOUNTER — Other Ambulatory Visit: Payer: Self-pay

## 2020-12-25 ENCOUNTER — Ambulatory Visit (HOSPITAL_COMMUNITY)
Admission: EM | Admit: 2020-12-25 | Discharge: 2020-12-25 | Disposition: A | Discharge status: Home / Self Care | Payer: BC Managed Care – PPO | Attending: Family Medicine | Admitting: Family Medicine

## 2020-12-25 DIAGNOSIS — J069 Acute upper respiratory infection, unspecified: Secondary | ICD-10-CM

## 2020-12-25 LAB — POC INFLUENZA A AND B ANTIGEN (URGENT CARE ONLY)
INFLUENZA A ANTIGEN, POC: NEGATIVE
INFLUENZA B ANTIGEN, POC: NEGATIVE

## 2020-12-25 LAB — POCT RAPID STREP A, ED / UC: Streptococcus, Group A Screen (Direct): NEGATIVE

## 2020-12-25 MED ORDER — LIDOCAINE VISCOUS HCL 2 % MT SOLN: 10.0000 mL | OROMUCOSAL | 0 refills | Status: DC | PRN

## 2020-12-25 NOTE — ED Provider Notes (Signed)
[facility]    CSN: [id number] Arrival date & time: 12/25/20  1504      History   Chief Complaint Chief Complaint  Patient presents with   Fever    HPI Erin Joseph is a 20 y.o. female.   Patient presenting today with 1 day history of headache, fever, body aches, nausea, vomiting, sore throat.  Denies cough, chest pain, shortness of breath, abdominal pain, diarrhea.  Works at a daycare some multiple sick contacts.  So far took some ibuprofen with minimal relief.  No known pertinent chronic medical problems.   Past Medical History:  Diagnosis Date   Anxiety    Depression    Dysmenorrhea in adolescent    Gastritis    Migraines    Pelvic pain    Vaccine for human papilloma virus (HPV) types 6, 11, 16, and 18 administered     Patient Active Problem List   Diagnosis Date Noted   Dyspareunia in female 08/25/2019   Migraines 07/31/2017   Intractable migraine with aura without status migrainosus 09/19/2016   Dysmenorrhea in the adolescent 09/19/2016   Pelvic pain 09/19/2016    Past Surgical History:  Procedure Laterality Date   CHOLECYSTECTOMY      OB History     Gravida  0   Para  0   Term  0   Preterm  0   AB  0   Living  0      SAB  0   IAB  0   Ectopic  0   Multiple  0   Live Births  0            Home Medications    Prior to Admission medications   Medication Sig Start Date End Date Taking? Authorizing Provider  lidocaine (XYLOCAINE) 2 % solution Use as directed 10 mLs in the mouth or throat as needed for mouth pain. 12/25/20  Yes Particia Nearing, PA-C  acetaminophen (TYLENOL) 325 MG tablet Take 650 mg by mouth every 6 (six) hours as needed for mild pain or moderate pain.    [provider]  albuterol (VENTOLIN HFA) 108 (90 Base) MCG/ACT inhaler Inhale 1-2 puffs into the lungs every 4 (four) hours as needed for wheezing or shortness of breath. 11/29/20   Domenick Gong, MD  clonazePAM (KLONOPIN) 0.25 MG  disintegrating tablet Take by mouth.    [provider]  Galcanezumab-gnlm (EMGALITY) 120 MG/ML SOAJ Inject into the skin. Patient not taking: Reported on 08/25/2019 03/05/19   [provider]  lamoTRIgine (LAMICTAL) 100 MG tablet Take 1 tablet (100 mg total) by mouth daily. Take along with 200mg  tablet daily for a total of 300mg  04/05/20 05/05/20  Ward, 04/07/20, PA-C  lamoTRIgine (LAMICTAL) 200 MG tablet Take 1 tablet (200 mg total) by mouth daily. Take along with 100mg  tablet daily for a total of 300mg  04/05/20 05/05/20  Ward, Z, PA-C  LORazepam (ATIVAN) 1 MG tablet TK 1/2 TO 1 T PO TO ABORT PANIC ATTACK 10/02/18   [provider]  medroxyPROGESTERone Acetate 150 MG/ML SUSY Inject 1 mL (150 mg total) into the muscle once for 1 dose. 08/25/19 08/25/19  Copland, Shanda Bumps, PA-C  ondansetron (ZOFRAN ODT) 4 MG disintegrating tablet Take 1 tablet (4 mg total) by mouth every 8 (eight) hours as needed for nausea or vomiting. 11/29/20   10/25/19, MD  promethazine-dextromethorphan (PROMETHAZINE-DM) 6.25-15 MG/5ML syrup Take 5 mLs by mouth 4 (four) times daily as needed for  cough. 11/29/20   Domenick Gong, MD  QUEtiapine (SEROQUEL) 25 MG tablet Take 1 tablet (25 mg total) by mouth at bedtime. 04/05/20 05/05/20  Ward, Tylene Fantasia, PA-C  Rimegepant Sulfate 75 MG TBDP Take by mouth. 08/04/19   [provider]  Spacer/Aero-Holding Chambers (AEROCHAMBER PLUS) inhaler Use with inhaler 11/29/20   Domenick Gong, MD  SUMAtriptan (IMITREX) 50 MG tablet Take 1 tablet (50 mg total) by mouth once as needed for up to 1 dose for migraine. May repeat one dose in 2 hours if headache persists, for max dose 24 hours 07/15/18   Karamalegos, Netta Neat, DO    Family History Family History  Problem Relation Age of Onset   Hypertension Mother    Diabetes Mother        prediabetic   Heart failure Father    Endometriosis Maternal Grandmother    Migraines Paternal Grandmother    Breast  cancer Neg Hx    Ovarian cancer Neg Hx     Social History Social History   Tobacco Use   Smoking status: Never   Smokeless tobacco: Never  Vaping Use   Vaping Use: Never used  Substance Use Topics   Alcohol use: No   Drug use: Yes    Types: Marijuana     Allergies   Patient has no known allergies.   Review of Systems Review of Systems Per HPI  Physical Exam Triage Vital Signs ED Triage Vitals  Enc Vitals Group     BP 12/25/20 1650 107/64     Pulse Rate 12/25/20 1650 94     Resp 12/25/20 1650 18     Temp 12/25/20 1650 100.1 F (37.8 C)     Temp src --      SpO2 12/25/20 1650 99 %     Weight --      Height --      Head Circumference --      Peak Flow --      Pain Score 12/25/20 1646 4     Pain Loc --      Pain Edu? --      Excl. in GC? --    No data found.  Updated Vital Signs BP 107/64   Pulse 94   Temp 100.1 F (37.8 C)   Resp 18   LMP 12/04/2020   SpO2 99%   Visual Acuity Right Eye Distance:   Left Eye Distance:   Bilateral Distance:    Right Eye Near:   Left Eye Near:    Bilateral Near:     Physical Exam Vitals and nursing note reviewed.  Constitutional:      Appearance: Normal appearance. She is not ill-appearing.  HENT:     Head: Atraumatic.     Right Ear: Tympanic membrane normal.     Left Ear: Tympanic membrane normal.     Nose: Nose normal.     Mouth/Throat:     Pharynx: Posterior oropharyngeal erythema present. No oropharyngeal exudate.     Comments: No tonsillar edema, uvula midline, oral airway patent Eyes:     Extraocular Movements: Extraocular movements intact.     Conjunctiva/sclera: Conjunctivae normal.  Cardiovascular:     Rate and Rhythm: Normal rate and regular rhythm.     Heart sounds: Normal heart sounds.  Pulmonary:     Effort: Pulmonary effort is normal. No respiratory distress.     Breath sounds: Normal breath sounds. No wheezing or rales.  Abdominal:     General: Bowel sounds  are normal. There is no  distension.     Palpations: Abdomen is soft.     Tenderness: There is no abdominal tenderness. There is no guarding.  Musculoskeletal:        General: Normal range of motion.     Cervical back: Normal range of motion and neck supple.  Lymphadenopathy:     Cervical: No cervical adenopathy.  Skin:    General: Skin is warm and dry.  Neurological:     Mental Status: She is alert and oriented to person, place, and time.     Motor: No weakness.     Gait: Gait normal.  Psychiatric:        Mood and Affect: Mood normal.        Thought Content: Thought content normal.        Judgment: Judgment normal.     UC Treatments / Results  Labs (all labs ordered are listed, but only abnormal results are displayed) Labs Reviewed  POC INFLUENZA A AND B ANTIGEN (URGENT CARE ONLY)  POCT RAPID STREP A, ED / UC    EKG   Radiology No results found.  Procedures Procedures (including critical care time)  Medications Ordered in UC Medications - No data to display  Initial Impression / Assessment and Plan / UC Course  I have reviewed the triage vital signs and the nursing notes.  Pertinent labs & imaging results that were available during my care of the patient were reviewed by me and considered in my medical decision making (see chart for details).     Low grade fever in triage, otherwise VS reassuring and WNL. Consistent with viral URI. Rapid flu neg, rapid strep neg, throat culture pending, will forgo COVID testing as she had COVID 4 weeks ago. Treat with viscous lidocaine, OTC cold and congestion medications and supportive home care. Return for worsening sxs.   Final Clinical Impressions(s) / UC Diagnoses   Final diagnoses:  Viral URI   Discharge Instructions   None    ED Prescriptions     Medication Sig Dispense Auth. Provider   lidocaine (XYLOCAINE) 2 % solution Use as directed 10 mLs in the mouth or throat as needed for mouth pain. 100 mL Particia Nearing, [place]       PDMP not reviewed this encounter.   Roosvelt Maser [place], [place] 12/26/20 [phone]

## 2020-12-25 NOTE — ED Triage Notes (Signed)
Pt reports fever and body aches and sore throat started yesterday.

## 2021-02-02 ENCOUNTER — Other Ambulatory Visit: Payer: Self-pay

## 2021-02-02 ENCOUNTER — Encounter (HOSPITAL_COMMUNITY): Payer: Self-pay | Admitting: Emergency Medicine

## 2021-02-02 ENCOUNTER — Ambulatory Visit (HOSPITAL_COMMUNITY)
Admission: EM | Admit: 2021-02-02 | Discharge: 2021-02-02 | Disposition: A | Discharge status: Home / Self Care | Payer: BC Managed Care – PPO | Attending: Emergency Medicine | Admitting: Emergency Medicine

## 2021-02-02 DIAGNOSIS — J069 Acute upper respiratory infection, unspecified: Secondary | ICD-10-CM | POA: Diagnosis present

## 2021-02-02 DIAGNOSIS — Z8616 Personal history of COVID-19: Secondary | ICD-10-CM | POA: Insufficient documentation

## 2021-02-02 DIAGNOSIS — Z7951 Long term (current) use of inhaled steroids: Secondary | ICD-10-CM | POA: Diagnosis not present

## 2021-02-02 DIAGNOSIS — Z20822 Contact with and (suspected) exposure to covid-19: Secondary | ICD-10-CM | POA: Insufficient documentation

## 2021-02-02 LAB — POC INFLUENZA A AND B ANTIGEN (URGENT CARE ONLY)
INFLUENZA A ANTIGEN, POC: NEGATIVE
INFLUENZA B ANTIGEN, POC: NEGATIVE

## 2021-02-02 LAB — SARS CORONAVIRUS 2 (TAT 6-24 HRS): SARS Coronavirus 2: NEGATIVE

## 2021-02-02 MED ORDER — AMOXICILLIN-POT CLAVULANATE 875-125 MG PO TABS: 1.0000 | ORAL_TABLET | Freq: Two times a day (BID) | ORAL | 0 refills | Status: DC

## 2021-02-02 NOTE — ED Provider Notes (Signed)
[facility]    CSN: [id number] Arrival date & time: 02/02/21  5797      History   Chief Complaint Chief Complaint  Patient presents with   Sore Throat   Cough    HPI Erin Joseph is a 21 y.o. female. Pt had covid in November, URI then bronchitis in December. Was completely well from last illness until became ill again 4 days ago.  Complains of nasal congestion, sore throat, cough, fever, body aches, shortness of breath.  Denies wheezing.  Cough is so bothersome sometimes it makes her vomit.  Has been using Neti pot saline for irrigation, Mucinex, tea with honey, elderberry syrup for minimal relief of symptoms.  Has not albuterol inhaler left over from when she had COVID that she is used in the last couple of days that is helped relieve her shortness of breath.  In the last 2 days nasal congestion has started turning yellow.  Works in a daycare   Sore Throat Associated symptoms include shortness of breath.  Cough Associated symptoms: chills, fever, rhinorrhea, shortness of breath and sore throat   Associated symptoms: no ear pain and no wheezing    Past Medical History:  Diagnosis Date   Anxiety    Depression    Dysmenorrhea in adolescent    Gastritis    Migraines    Pelvic pain    Vaccine for human papilloma virus (HPV) types 6, 11, 16, and 18 administered     Patient Active Problem List   Diagnosis Date Noted   Dyspareunia in female 08/25/2019   Migraines 07/31/2017   Intractable migraine with aura without status migrainosus 09/19/2016   Dysmenorrhea in the adolescent 09/19/2016   Pelvic pain 09/19/2016    Past Surgical History:  Procedure Laterality Date   CHOLECYSTECTOMY      OB History     Gravida  0   Para  0   Term  0   Preterm  0   AB  0   Living  0      SAB  0   IAB  0   Ectopic  0   Multiple  0   Live Births  0            Home Medications    Prior to Admission medications   Medication Sig Start Date End Date  Taking? Authorizing Provider  amoxicillin-clavulanate (AUGMENTIN) 875-125 MG tablet Take 1 tablet by mouth every 12 (twelve) hours. 02/02/21  Yes Cathlyn Parsons, NP  acetaminophen (TYLENOL) 325 MG tablet Take 650 mg by mouth every 6 (six) hours as needed for mild pain or moderate pain.    [provider]  albuterol (VENTOLIN HFA) 108 (90 Base) MCG/ACT inhaler Inhale 1-2 puffs into the lungs every 4 (four) hours as needed for wheezing or shortness of breath. 11/29/20   Domenick Gong, MD  clonazePAM (KLONOPIN) 0.25 MG disintegrating tablet Take by mouth.    [provider]  Galcanezumab-gnlm (EMGALITY) 120 MG/ML SOAJ Inject into the skin. Patient not taking: Reported on 08/25/2019 03/05/19   [provider]  lamoTRIgine (LAMICTAL) 100 MG tablet Take 1 tablet (100 mg total) by mouth daily. Take along with 200mg  tablet daily for a total of 300mg  04/05/20 05/05/20  Ward, 04/07/20, PA-C  lamoTRIgine (LAMICTAL) 200 MG tablet Take 1 tablet (200 mg total) by mouth daily. Take along with 100mg  tablet daily for a total of 300mg  04/05/20 05/05/20  Ward, , PA-C  lidocaine (XYLOCAINE)  2 % solution Use as directed 10 mLs in the mouth or throat as needed for mouth pain. 12/25/20   Particia Nearing, PA-C  LORazepam (ATIVAN) 1 MG tablet TK 1/2 TO 1 T PO TO ABORT PANIC ATTACK 10/02/18   [provider]  medroxyPROGESTERone Acetate 150 MG/ML SUSY Inject 1 mL (150 mg total) into the muscle once for 1 dose. 08/25/19 08/25/19  Copland, Ilona Sorrel, PA-C  ondansetron (ZOFRAN ODT) 4 MG disintegrating tablet Take 1 tablet (4 mg total) by mouth every 8 (eight) hours as needed for nausea or vomiting. 11/29/20   Domenick Gong, MD  promethazine-dextromethorphan (PROMETHAZINE-DM) 6.25-15 MG/5ML syrup Take 5 mLs by mouth 4 (four) times daily as needed for cough. 11/29/20   Domenick Gong, MD  QUEtiapine (SEROQUEL) 25 MG tablet Take 1 tablet (25 mg total) by mouth at bedtime. 04/05/20 05/05/20   Ward, Tylene Fantasia, PA-C  Rimegepant Sulfate 75 MG TBDP Take by mouth. 08/04/19   [provider]  Spacer/Aero-Holding Chambers (AEROCHAMBER PLUS) inhaler Use with inhaler 11/29/20   Domenick Gong, MD  SUMAtriptan (IMITREX) 50 MG tablet Take 1 tablet (50 mg total) by mouth once as needed for up to 1 dose for migraine. May repeat one dose in 2 hours if headache persists, for max dose 24 hours 07/15/18   Karamalegos, Netta Neat, DO    Family History Family History  Problem Relation Age of Onset   Hypertension Mother    Diabetes Mother        prediabetic   Heart failure Father    Endometriosis Maternal Grandmother    Migraines Paternal Grandmother    Breast cancer Neg Hx    Ovarian cancer Neg Hx     Social History Social History   Tobacco Use   Smoking status: Never   Smokeless tobacco: Never  Vaping Use   Vaping Use: Never used  Substance Use Topics   Alcohol use: No   Drug use: Yes    Types: Marijuana     Allergies   Patient has no known allergies.   Review of Systems Review of Systems  Constitutional:  Positive for chills, fatigue and fever.  HENT:  Positive for congestion, postnasal drip, rhinorrhea and sore throat. Negative for ear pain and sinus pressure.   Respiratory:  Positive for cough and shortness of breath. Negative for wheezing.     Physical Exam Triage Vital Signs ED Triage Vitals  Enc Vitals Group     BP 02/02/21 1006 (!) 109/49     Pulse Rate 02/02/21 1006 95     Resp 02/02/21 1006 17     Temp 02/02/21 1006 98.1 F (36.7 C)     Temp Source 02/02/21 1006 Oral     SpO2 02/02/21 1006 100 %     Weight --      Height --      Head Circumference --      Peak Flow --      Pain Score 02/02/21 1005 5     Pain Loc --      Pain Edu? --      Excl. in GC? --    No data found.  Updated Vital Signs BP (!) 109/49 (BP Location: Right Arm)    Pulse 95    Temp 98.1 F (36.7 C) (Oral)    Resp 17    SpO2 100%   Visual Acuity Right Eye Distance:    Left Eye Distance:   Bilateral Distance:    Right Eye  Near:   Left Eye Near:    Bilateral Near:     Physical Exam Constitutional:      General: She is not in acute distress.    Appearance: She is well-developed and normal weight. She is ill-appearing.  HENT:     Right Ear: Tympanic membrane, ear canal and external ear normal.     Left Ear: Tympanic membrane, ear canal and external ear normal.     Nose:     Right Sinus: No maxillary sinus tenderness or frontal sinus tenderness.     Left Sinus: No maxillary sinus tenderness or frontal sinus tenderness.     Mouth/Throat:     Mouth: Mucous membranes are moist.     Pharynx: Oropharynx is clear.  Cardiovascular:     Rate and Rhythm: Regular rhythm. Tachycardia present.  Pulmonary:     Effort: Pulmonary effort is normal.     Breath sounds: Normal breath sounds.     Comments: Occasional coughing spasms observed during H&P Lymphadenopathy:     Cervical: No cervical adenopathy.  Neurological:     Mental Status: She is alert.     UC Treatments / Results  Labs (all labs ordered are listed, but only abnormal results are displayed) Labs Reviewed  SARS CORONAVIRUS 2 (TAT 6-24 HRS)  POC INFLUENZA A AND B ANTIGEN (URGENT CARE ONLY)    EKG   Radiology No results found.  Procedures Procedures (including critical care time)  Medications Ordered in UC Medications - No data to display  Initial Impression / Assessment and Plan / UC Course  I have reviewed the triage vital signs and the nursing notes.  Pertinent labs & imaging results that were available during my care of the patient were reviewed by me and considered in my medical decision making (see chart for details).    Flu test negative here.  COVID test pending.  Like patient to be more aggressive with saline irrigation and the use of Mucinex before taking an antibiotic.  She may be on the cusp of developing a secondary bacterial infection.  I prescribed Augmentin but  asked her not to get it filled right away and instead work on managing her nasal congestion and drainage at home first and only went in a day or 2 if symptoms progress despite treatments  Final Clinical Impressions(s) / UC Diagnoses   Final diagnoses:  Viral URI with cough     Discharge Instructions      For Neti pot, use distilled water (not tap water) - ok to microwave it to warm temperature. Use either the neti pot salt packets or you can mix your own. If you mix your own, mix 1 cup warm distilled water with 1/2 teaspoon kosher salt or sea salt (not regular table salt as it has iodine in it).   Use Neti pot 3-4 times a day or more to help relieve your congestion. Use mucinex (generic guaifenesin is okay to use) as directed on the package.  Add lemon juice to your tea with honey to help relieve your sore throat.  Gargling with salt water can also help relieve the sore throat.  If you are using neti pot regularly, taking Mucinex, and you still have worsening thick discolored nasal drainage, you may have a secondary bacterial infection and you may need an antibiotic.  I prescribed an antibiotic by asking not to get the prescription for a day or 2 and to try regular saline irrigation and Mucinex first  ED Prescriptions     Medication Sig Dispense Auth. Provider   amoxicillin-clavulanate (AUGMENTIN) 875-125 MG tablet Take 1 tablet by mouth every 12 (twelve) hours. 14 tablet Cathlyn ParsonsKabbe, Lenoard Helbert M, NP      PDMP not reviewed this encounter.   Cathlyn ParsonsKabbe, Darcia Lampi M, NP 02/02/21 1103

## 2021-02-02 NOTE — ED Notes (Signed)
No answer from lobby  

## 2021-02-02 NOTE — ED Triage Notes (Signed)
Pt c/o cough, sore throat, congestion and not felling well for several days.

## 2021-02-02 NOTE — Discharge Instructions (Addendum)
For Neti pot, use distilled water (not tap water) - ok to microwave it to warm temperature. Use either the neti pot salt packets or you can mix your own. If you mix your own, mix 1 cup warm distilled water with 1/2 teaspoon kosher salt or sea salt (not regular table salt as it has iodine in it).   Use Neti pot 3-4 times a day or more to help relieve your congestion. Use mucinex (generic guaifenesin is okay to use) as directed on the package.  Add lemon juice to your tea with honey to help relieve your sore throat.  Gargling with salt water can also help relieve the sore throat.  If you are using neti pot regularly, taking Mucinex, and you still have worsening thick discolored nasal drainage, you may have a secondary bacterial infection and you may need an antibiotic.  I prescribed an antibiotic by asking not to get the prescription for a day or 2 and to try regular saline irrigation and Mucinex first

## 2021-02-07 ENCOUNTER — Other Ambulatory Visit: Payer: Self-pay

## 2021-02-07 ENCOUNTER — Encounter (HOSPITAL_COMMUNITY): Payer: Self-pay

## 2021-02-07 ENCOUNTER — Ambulatory Visit (HOSPITAL_COMMUNITY)
Admission: EM | Admit: 2021-02-07 | Discharge: 2021-02-07 | Disposition: A | Discharge status: Home / Self Care | Payer: BC Managed Care – PPO | Attending: Internal Medicine | Admitting: Internal Medicine

## 2021-02-07 DIAGNOSIS — R058 Other specified cough: Secondary | ICD-10-CM | POA: Diagnosis not present

## 2021-02-07 MED ORDER — ONDANSETRON 4 MG PO TBDP: 4.0000 mg | ORAL_TABLET | Freq: Three times a day (TID) | ORAL | 0 refills | Status: DC | PRN

## 2021-02-07 MED ORDER — FLUTICASONE PROPIONATE 50 MCG/ACT NA SUSP: 1.0000 | Freq: Every day | NASAL | 0 refills | Status: DC

## 2021-02-07 MED ORDER — PREDNISONE 10 MG (21) PO TBPK: ORAL_TABLET | Freq: Every day | ORAL | 0 refills | Status: DC

## 2021-02-07 MED ORDER — BENZONATATE 100 MG PO CAPS: 100.0000 mg | ORAL_CAPSULE | Freq: Three times a day (TID) | ORAL | 0 refills | Status: DC

## 2021-02-07 NOTE — ED Triage Notes (Signed)
Pt presents with ongoing productive cough with colored mucus and shortness of breath for over 3 weeks; pt states she was treated for bronchitis 3 weeks ago and recently treated this week for sinus infection. Pt recently started having intermittent vomiting.

## 2021-02-07 NOTE — Discharge Instructions (Addendum)
Take medications as prescribed Humidifier use will be helpful Maintain adequate hydration No indication for chest x-ray at this time Follow-up with primary care physician or return to urgent care if symptoms worsen.

## 2021-02-07 NOTE — ED Provider Notes (Signed)
[facility]    CSN: [id number] Arrival date & time: 02/07/21  1002      History   Chief Complaint Chief Complaint  Patient presents with   URI   Shortness of Breath   Emesis    HPI Erin Joseph is a 21 y.o. female comes to the urgent care with persistent cough, discolored sputum production and shortness of breath over the past 3 weeks.  Patient was treated with a course of antibiotics.  Patient continues to have significant cough productive of sputum.  Sputum is slightly discolored.  She has bouts of coughing and some emesis with that.  No shortness of breath.  She endorses some wheezing.  No fever or chills.  Patient endorses postnasal drainage.  No chest pain or chest tightness. HPI  Past Medical History:  Diagnosis Date   Anxiety    Depression    Dysmenorrhea in adolescent    Gastritis    Migraines    Pelvic pain    Vaccine for human papilloma virus (HPV) types 6, 11, 16, and 18 administered     Patient Active Problem List   Diagnosis Date Noted   Dyspareunia in female 08/25/2019   Migraines 07/31/2017   Intractable migraine with aura without status migrainosus 09/19/2016   Dysmenorrhea in the adolescent 09/19/2016   Pelvic pain 09/19/2016    Past Surgical History:  Procedure Laterality Date   CHOLECYSTECTOMY      OB History     Gravida  0   Para  0   Term  0   Preterm  0   AB  0   Living  0      SAB  0   IAB  0   Ectopic  0   Multiple  0   Live Births  0            Home Medications    Prior to Admission medications   Medication Sig Start Date End Date Taking? Authorizing Provider  benzonatate (TESSALON) 100 MG capsule Take 1 capsule (100 mg total) by mouth every 8 (eight) hours. 02/07/21  Yes Carvel Huskins, Britta Mccreedy, MD  fluticasone (FLONASE) 50 MCG/ACT nasal spray Place 1 spray into both nostrils daily. 02/07/21  Yes Shanesha Bednarz, Britta Mccreedy, MD  ondansetron (ZOFRAN-ODT) 4 MG disintegrating tablet Take 1 tablet (4 mg total) by  mouth every 8 (eight) hours as needed for nausea or vomiting. 02/07/21  Yes Fisher Hargadon, Britta Mccreedy, MD  predniSONE (STERAPRED UNI-PAK 21 TAB) 10 MG (21) TBPK tablet Take by mouth daily. Take 6 tabs by mouth daily  for 2 days, then 5 tabs for 2 days, then 4 tabs for 2 days, then 3 tabs for 2 days, 2 tabs for 2 days, then 1 tab by mouth daily for 2 days 02/07/21  Yes Lilliona Blakeney, Britta Mccreedy, MD  acetaminophen (TYLENOL) 325 MG tablet Take 650 mg by mouth every 6 (six) hours as needed for mild pain or moderate pain.    [provider]  albuterol (VENTOLIN HFA) 108 (90 Base) MCG/ACT inhaler Inhale 1-2 puffs into the lungs every 4 (four) hours as needed for wheezing or shortness of breath. 11/29/20   Domenick Gong, MD  amoxicillin-clavulanate (AUGMENTIN) 875-125 MG tablet Take 1 tablet by mouth every 12 (twelve) hours. 02/02/21   Cathlyn Parsons, NP  clonazePAM (KLONOPIN) 0.25 MG disintegrating tablet Take by mouth.    [provider]  lamoTRIgine (LAMICTAL) 100 MG tablet Take 1 tablet (100 mg total) by mouth  daily. Take along with 200mg  tablet daily for a total of 300mg  04/05/20 05/05/20  Ward, 04/07/20, PA-C  lamoTRIgine (LAMICTAL) 200 MG tablet Take 1 tablet (200 mg total) by mouth daily. Take along with 100mg  tablet daily for a total of 300mg  04/05/20 05/05/20  Ward, Z, PA-C  lidocaine (XYLOCAINE) 2 % solution Use as directed 10 mLs in the mouth or throat as needed for mouth pain. 12/25/20   04/07/20, PA-C  LORazepam (ATIVAN) 1 MG tablet TK 1/2 TO 1 T PO TO ABORT PANIC ATTACK 10/02/18   [provider]  medroxyPROGESTERone Acetate 150 MG/ML SUSY Inject 1 mL (150 mg total) into the muscle once for 1 dose. 08/25/19 08/25/19  Copland, Particia Nearing, PA-C  QUEtiapine (SEROQUEL) 25 MG tablet Take 1 tablet (25 mg total) by mouth at bedtime. 04/05/20 05/05/20  Ward, 10/25/19, PA-C  Rimegepant Sulfate 75 MG TBDP Take by mouth. 08/04/19   [provider]  Spacer/Aero-Holding Chambers  (AEROCHAMBER PLUS) inhaler Use with inhaler 11/29/20   05/07/20, MD  SUMAtriptan (IMITREX) 50 MG tablet Take 1 tablet (50 mg total) by mouth once as needed for up to 1 dose for migraine. May repeat one dose in 2 hours if headache persists, for max dose 24 hours 07/15/18   Karamalegos, 10/05/19, DO    Family History Family History  Problem Relation Age of Onset   Hypertension Mother    Diabetes Mother        prediabetic   Heart failure Father    Endometriosis Maternal Grandmother    Migraines Paternal Grandmother    Breast cancer Neg Hx    Ovarian cancer Neg Hx     Social History Social History   Tobacco Use   Smoking status: Never   Smokeless tobacco: Never  Vaping Use   Vaping Use: Never used  Substance Use Topics   Alcohol use: No   Drug use: Yes    Types: Marijuana     Allergies   Patient has no known allergies.   Review of Systems Review of Systems As per HPI  Physical Exam Triage Vital Signs ED Triage Vitals  Enc Vitals Group     BP 02/07/21 1055 131/67     Pulse Rate 02/07/21 1055 92     Resp 02/07/21 1055 17     Temp 02/07/21 1055 98.9 F (37.2 C)     Temp Source 02/07/21 1055 Oral     SpO2 02/07/21 1055 98 %     Weight --      Height --      Head Circumference --      Peak Flow --      Pain Score 02/07/21 1100 5     Pain Loc --      Pain Edu? --      Excl. in GC? --    No data found.  Updated Vital Signs BP 131/67 (BP Location: Right Arm)    Pulse 92    Temp 98.9 F (37.2 C) (Oral)    Resp 17    LMP 02/07/2021    SpO2 98%   Visual Acuity Right Eye Distance:   Left Eye Distance:   Bilateral Distance:    Right Eye Near:   Left Eye Near:    Bilateral Near:     Physical Exam Vitals reviewed.  Constitutional:      General: She is not in acute distress.    Appearance: She is well-developed. She is  ill-appearing.  Cardiovascular:     Rate and Rhythm: Normal rate and regular rhythm.  Pulmonary:     Effort: Pulmonary effort  is normal. No tachypnea, bradypnea, accessory muscle usage or respiratory distress.     Breath sounds: Normal breath sounds. No decreased breath sounds, wheezing, rhonchi or rales.  Abdominal:     General: Bowel sounds are normal.     Palpations: Abdomen is soft.  Musculoskeletal:     Cervical back: Neck supple.  Neurological:     Mental Status: She is alert.     UC Treatments / Results  Labs (all labs ordered are listed, but only abnormal results are displayed) Labs Reviewed - No data to display  EKG   Radiology No results found.  Procedures Procedures (including critical care time)  Medications Ordered in UC Medications - No data to display  Initial Impression / Assessment and Plan / UC Course  I have reviewed the triage vital signs and the nursing notes.  Pertinent labs & imaging results that were available during my care of the patient were reviewed by me and considered in my medical decision making (see chart for details).     1.  Postviral cough syndrome: Tapering dose of prednisone Zofran as needed for nausea/vomiting. Tessalon Perles as needed for cough No indication for imaging at this time since patient recently had a chest x-ray which was unremarkable. Return precautions given Final Clinical Impressions(s) / UC Diagnoses   Final diagnoses:  Post-viral cough syndrome     Discharge Instructions      Take medications as prescribed Humidifier use will be helpful Maintain adequate hydration No indication for chest x-ray at this time Follow-up with primary care physician or return to urgent care if symptoms worsen.     ED Prescriptions     Medication Sig Dispense Auth. Provider   fluticasone (FLONASE) 50 MCG/ACT nasal spray Place 1 spray into both nostrils daily. 16 g Senay Sistrunk, Britta MccreedyPhilip O, MD   benzonatate (TESSALON) 100 MG capsule Take 1 capsule (100 mg total) by mouth every 8 (eight) hours. 21 capsule Estephania Licciardi, Britta MccreedyPhilip O, MD   predniSONE (STERAPRED  UNI-PAK 21 TAB) 10 MG (21) TBPK tablet Take by mouth daily. Take 6 tabs by mouth daily  for 2 days, then 5 tabs for 2 days, then 4 tabs for 2 days, then 3 tabs for 2 days, 2 tabs for 2 days, then 1 tab by mouth daily for 2 days 42 tablet Abagayle Klutts, Britta MccreedyPhilip O, MD   ondansetron (ZOFRAN-ODT) 4 MG disintegrating tablet Take 1 tablet (4 mg total) by mouth every 8 (eight) hours as needed for nausea or vomiting. 20 tablet Naiomi Musto, Britta MccreedyPhilip O, MD      PDMP not reviewed this encounter.   Merrilee JanskyLamptey, Vijay Durflinger O, MD 02/09/21 [phone]

## 2021-11-16 ENCOUNTER — Other Ambulatory Visit: Payer: Self-pay

## 2021-11-16 ENCOUNTER — Emergency Department
Admission: EM | Admit: 2021-11-16 | Discharge: 2021-11-16 | Disposition: A | Discharge status: Home / Self Care | Payer: BC Managed Care – PPO | Source: Home / Self Care

## 2021-11-16 ENCOUNTER — Emergency Department: Payer: BC Managed Care – PPO

## 2021-11-16 DIAGNOSIS — K529 Noninfective gastroenteritis and colitis, unspecified: Secondary | ICD-10-CM

## 2021-11-16 DIAGNOSIS — D72829 Elevated white blood cell count, unspecified: Secondary | ICD-10-CM | POA: Insufficient documentation

## 2021-11-16 LAB — COMPREHENSIVE METABOLIC PANEL
ALT: 21 U/L (ref 0–44)
AST: 23 U/L (ref 15–41)
Albumin: 4.2 g/dL (ref 3.5–5.0)
Alkaline Phosphatase: 48 U/L (ref 38–126)
Anion gap: 9 (ref 5–15)
BUN: 14 mg/dL (ref 6–20)
CO2: 22 mmol/L (ref 22–32)
Calcium: 9.3 mg/dL (ref 8.9–10.3)
Chloride: 106 mmol/L (ref 98–111)
Creatinine, Ser: 0.76 mg/dL (ref 0.44–1.00)
GFR, Estimated: >60 mL/min (ref >60)
Glucose, Bld: 123 mg/dL — ABNORMAL HIGH (ref 70–99)
Potassium: 3.6 mmol/L (ref 3.5–5.1)
Sodium: 137 mmol/L (ref 135–145)
Total Bilirubin: 1.7 mg/dL — ABNORMAL HIGH (ref 0.3–1.2)
Total Protein: 8.2 g/dL — ABNORMAL HIGH (ref 6.5–8.1)

## 2021-11-16 LAB — CBC
HCT: 41.7 % (ref 36.0–46.0)
Hemoglobin: 13.8 g/dL (ref 12.0–15.0)
MCH: 30.1 pg (ref 26.0–34.0)
MCHC: 33.1 g/dL (ref 30.0–36.0)
MCV: 90.8 fL (ref 80.0–100.0)
Platelets: 212 10*3/uL (ref 150–400)
RBC: 4.59 MIL/uL (ref 3.87–5.11)
RDW: 13 % (ref 11.5–15.5)
WBC: 14.6 10*3/uL — ABNORMAL HIGH (ref 4.0–10.5)
nRBC: 0 % (ref 0.0–0.2)

## 2021-11-16 LAB — URINALYSIS, ROUTINE W REFLEX MICROSCOPIC
Bacteria, UA: NONE SEEN
Bilirubin Urine: NEGATIVE
Glucose, UA: NEGATIVE mg/dL
Hgb urine dipstick: NEGATIVE
Ketones, ur: 5 mg/dL — AB
Nitrite: NEGATIVE
Protein, ur: 100 mg/dL — AB
Specific Gravity, Urine: 1.026 (ref 1.005–1.030)
pH: 8 (ref 5.0–8.0)

## 2021-11-16 LAB — LIPASE, BLOOD: Lipase: 51 U/L (ref 11–51)

## 2021-11-16 LAB — POC URINE PREG, ED: Preg Test, Ur: NEGATIVE

## 2021-11-16 MED ORDER — IOHEXOL 300 MG/ML  SOLN
100.0000 mL | Freq: Once | INTRAMUSCULAR | Status: AC | PRN
  Administered 2021-11-16: 100 mL via INTRAVENOUS

## 2021-11-16 MED ORDER — MORPHINE SULFATE (PF) 4 MG/ML IV SOLN
4.0000 mg | Freq: Once | INTRAVENOUS | Status: AC
  Administered 2021-11-16: 4 mg via INTRAVENOUS
  Filled 2021-11-16: qty 1

## 2021-11-16 MED ORDER — HYDROCODONE-ACETAMINOPHEN 5-325 MG PO TABS: 1.0000 | ORAL_TABLET | ORAL | 0 refills | Status: AC | PRN

## 2021-11-16 MED ORDER — SODIUM CHLORIDE 0.9 % IV BOLUS
1000.0000 mL | Freq: Once | INTRAVENOUS | Status: AC
  Administered 2021-11-16: 1000 mL via INTRAVENOUS

## 2021-11-16 MED ORDER — ONDANSETRON HCL 4 MG/2ML IJ SOLN
4.0000 mg | Freq: Once | INTRAMUSCULAR | Status: AC
  Administered 2021-11-16: 4 mg via INTRAVENOUS
  Filled 2021-11-16: qty 2

## 2021-11-16 MED ORDER — DICYCLOMINE HCL 10 MG PO CAPS: 10.0000 mg | ORAL_CAPSULE | Freq: Three times a day (TID) | ORAL | 0 refills | Status: AC

## 2021-11-16 MED ORDER — DICYCLOMINE HCL 10 MG PO CAPS
10.0000 mg | ORAL_CAPSULE | Freq: Once | ORAL | Status: AC
  Administered 2021-11-16: 10 mg via ORAL
  Filled 2021-11-16: qty 1

## 2021-11-16 MED ORDER — FAMOTIDINE 20 MG PO TABS
20.0000 mg | ORAL_TABLET | Freq: Once | ORAL | Status: AC
  Administered 2021-11-16: 20 mg via ORAL
  Filled 2021-11-16: qty 1

## 2021-11-16 MED ORDER — FAMOTIDINE 20 MG PO TABS: 20.0000 mg | ORAL_TABLET | Freq: Every day | ORAL | 0 refills | Status: DC

## 2021-11-16 MED ORDER — ONDANSETRON 4 MG PO TBDP: 4.0000 mg | ORAL_TABLET | Freq: Three times a day (TID) | ORAL | 0 refills | Status: AC | PRN

## 2021-11-16 NOTE — ED Provider Notes (Signed)
[facility] Provider Note  Patient Contact: 3:36 PM (approximate)   History   Abdominal Pain   HPI  Erin Joseph is a 21 y.o. female who presents the emergency department complaining of central abdominal pain.  Patient has had some nausea, vomiting and diarrhea.  Reported fever at home that she arrives afebrile here.  Patient states that most of the symptoms began after eating at [employer] yesterday.  She is unsure whether she may have had food poisoning.  She did have a cholecystectomy 7 years ago and states that the pain feels similar.  There is no urinary changes.  No vaginal bleeding or discharge.  Patient denies any chance of pregnancy.     Physical Exam   Triage Vital Signs: ED Triage Vitals  Enc Vitals Group     BP 11/16/21 1429 99/75     Pulse Rate 11/16/21 1429 (!) 113     Resp 11/16/21 1429 16     Temp 11/16/21 1429 99.5 F (37.5 C)     Temp Source 11/16/21 1429 Oral     SpO2 11/16/21 1429 100 %     Weight 11/16/21 1430 171 lb (77.6 kg)     Height 11/16/21 1430 5\' 1"  (1.549 m)     Head Circumference --      Peak Flow --      Pain Score 11/16/21 1430 10     Pain Loc --      Pain Edu? --      Excl. in GC? --     Most recent vital signs: Vitals:   11/16/21 1429  BP: 99/75  Pulse: (!) 113  Resp: 16  Temp: 99.5 F (37.5 C)  SpO2: 100%     General: Alert and in no acute distress.   Cardiovascular:  Good peripheral perfusion Respiratory: Normal respiratory effort without tachypnea or retractions. Lungs CTAB. Good air entry to the bases with no decreased or absent breath sounds Gastrointestinal: No visible external abnormality of the abdominal wall.  Slightly hyperactive bowel sounds 4 quadrants.  Soft to palp in all quadrants.  Patient is tender diffusely along the lower abdomen in the periumbilical region as well.  No significant upper quadrant or epigastric tenderness.. No guarding or rigidity. No palpable masses. No distention.  No CVA tenderness. Musculoskeletal: Full range of motion to all extremities.  Neurologic:  No gross focal neurologic deficits are appreciated.  Skin:   No rash noted Other:   ED Results / Procedures / Treatments   Labs (all labs ordered are listed, but only abnormal results are displayed) Labs Reviewed  COMPREHENSIVE METABOLIC PANEL - Abnormal; Notable for the following components:      Result Value   Glucose, Bld 123 (*)    Total Protein 8.2 (*)    Total Bilirubin 1.7 (*)    All other components within normal limits  CBC - Abnormal; Notable for the following components:   WBC 14.6 (*)    All other components within normal limits  URINALYSIS, ROUTINE W REFLEX MICROSCOPIC - Abnormal; Notable for the following components:   Color, Urine YELLOW (*)    APPearance HAZY (*)    Ketones, ur 5 (*)    Protein, ur 100 (*)    Leukocytes,Ua TRACE (*)    All other components within normal limits  LIPASE, BLOOD  POC URINE PREG, ED     EKG     RADIOLOGY  I personally viewed, evaluated, and interpreted these images as part  of my medical decision making, as well as reviewing the written report by the radiologist.  ED Provider Interpretation: Small bowel loops are thickened consistent with enteritis.  No evidence of small bowel obstruction.  No evidence of free fluid concerning for perforation.  Patient has no evidence of appendicitis.  Gallbladder surgically absent.  CT ABDOMEN PELVIS W CONTRAST  Result Date: 11/16/2021 CLINICAL DATA:  Mid abdominal and left lower quadrant abdominal pain with nausea and vomiting for 1 day. EXAM: CT ABDOMEN AND PELVIS WITH CONTRAST TECHNIQUE: Multidetector CT imaging of the abdomen and pelvis was performed using the standard protocol following bolus administration of intravenous contrast. RADIATION DOSE REDUCTION: This exam was performed according to the departmental dose-optimization program which includes automated exposure control, adjustment of the mA  and/or kV according to patient size and/or use of iterative reconstruction technique. CONTRAST:  OMNIPAQUE IOHEXOL 300 MG/ML  SOLN COMPARISON:  Noncontrast abdominopelvic CT 09/15/2016. FINDINGS: Lower chest: Clear lung bases. No significant pleural or pericardial effusion. Hepatobiliary: The liver is normal in density without suspicious focal abnormality. No biliary dilatation post cholecystectomy. Pancreas: Unremarkable. No pancreatic ductal dilatation or surrounding inflammatory changes. Spleen: Normal in size without focal abnormality. Adrenals/Urinary Tract: Both adrenal glands appear normal. No evidence of urinary tract calculus, suspicious renal lesion or hydronephrosis. The bladder appears unremarkable for its degree of distention. Stomach/Bowel: No enteric contrast administered. The stomach appears unremarkable for its degree of distention. Possible mild diffuse small bowel wall thickening without distention or surrounding inflammation. The proximal colon is fluid-filled without significant distension, wall thickening or surrounding inflammation. The appendix appears normal. The distal colon is decompressed. Vascular/Lymphatic: There are no enlarged abdominal or pelvic lymph nodes. No significant vascular findings. Reproductive: The uterus and ovaries appear unremarkable. No adnexal mass. Other: No evidence of abdominal wall mass or hernia. No ascites. Musculoskeletal: No acute or significant osseous findings. IMPRESSION: 1. Possible mild diffuse small bowel wall thickening and fluid-filled proximal colon, suspicious for enteritis. No evidence of bowel obstruction, perforation or abscess. 2. The appendix appears normal. 3. No other significant findings. Electronically Signed   By: Carey Bullocks M.D.   On: 11/16/2021 17:26    PROCEDURES:  Critical Care performed: No  Procedures   MEDICATIONS ORDERED IN ED: Medications  morphine (PF) 4 MG/ML injection 4 mg (has no administration in time  range)  dicyclomine (BENTYL) capsule 10 mg (has no administration in time range)  ondansetron (ZOFRAN) injection 4 mg (has no administration in time range)  famotidine (PEPCID) tablet 20 mg (has no administration in time range)  sodium chloride 0.9 % bolus 1,000 mL (0 mLs Intravenous Stopped 11/16/21 1709)  morphine (PF) 4 MG/ML injection 4 mg (4 mg Intravenous Given 11/16/21 1610)  ondansetron (ZOFRAN) injection 4 mg (4 mg Intravenous Given 11/16/21 1610)  iohexol (OMNIPAQUE) 300 MG/ML solution 100 mL (100 mLs Intravenous Contrast Given 11/16/21 1709)     IMPRESSION / MDM / ASSESSMENT AND PLAN / ED COURSE  I reviewed the triage vital signs and the nursing notes.                              Differential diagnosis includes, but is not limited to, colitis, gastroenteritis, gastric ulcer, pancreatitis, appendicitis, diverticulitis  Patient's presentation is most consistent with acute presentation with potential threat to life or bodily function.   Patient's diagnosis is consistent with enteritis.  Patient presents to the emergency department concern for abdominal pain.  She states that this started after she had [employer] last night.  Unsure whether this may be food poisoning.  She did report a fever today up to 101 F.  Was afebrile on arrival.  Patient did have abdominal tenderness in the periumbilical and diffusely across the lower quadrants.  Gallbladder was surgically absent.  Patient did have slight elevation in her white blood cell count, tachycardia.  Patient had CT scan to evaluate abdominal pain and her nausea, vomiting and diarrhea.  Findings are consistent with enteritis without evidence of small bowel obstruction.  No evidence of colitis, appendicitis.  No evidence of perforation.  Patient will be treated symptomatically at home with Bentyl, bland diet, pain medication.  Patient is encouraged to use pain medicine sparingly as this can cause constipation which will worsen her symptoms.   Concerning signs and symptoms are discussed with the patient and her father who is present at this time..  Follow-up primary care as needed.  Patient is given ED precautions to return to the ED for any worsening or new symptoms.        FINAL CLINICAL IMPRESSION(S) / ED DIAGNOSES   Final diagnoses:  Enteritis     Rx / DC Orders   ED Discharge Orders          Ordered    HYDROcodone-acetaminophen (NORCO/VICODIN) 5-325 MG tablet  Every 4 hours PRN        11/16/21 1840    dicyclomine (BENTYL) 10 MG capsule  3 times daily        11/16/21 1840    famotidine (PEPCID) 20 MG tablet  Daily        11/16/21 1840    ondansetron (ZOFRAN-ODT) 4 MG disintegrating tablet  Every 8 hours PRN        11/16/21 1840             Note:  This document was prepared using Dragon voice recognition software and may include unintentional dictation errors.   Darletta Moll, PA-C 11/16/21 1842    Carrie Mew, MD 11/23/21 [phone]

## 2021-11-16 NOTE — ED Triage Notes (Signed)
First Nurse Note:  Arrives from [facility] walk in for ED evaluation of abdominal pain.  Per report, patient c/o mid abdominal pain x 1 day.  Decrease PO today.  Temp 101.2, otherwise VS wnl.

## 2021-11-16 NOTE — ED Notes (Signed)
Pt appears diaphoretic and hunched over in pain, states has burning and sharp lower abdominal pain since middle of night, obtaining urine sample now.

## 2021-11-16 NOTE — ED Triage Notes (Signed)
Pt c/o lower abd pain with n/v that started throughout the night.

## 2021-11-18 ENCOUNTER — Inpatient Hospital Stay
Admission: EM | Admit: 2021-11-18 | Discharge: 2021-11-22 | DRG: 392 | Disposition: A | Discharge status: Home / Self Care | Payer: BC Managed Care – PPO | Attending: Student in an Organized Health Care Education/Training Program | Admitting: Student in an Organized Health Care Education/Training Program

## 2021-11-18 ENCOUNTER — Emergency Department: Payer: BC Managed Care – PPO

## 2021-11-18 ENCOUNTER — Encounter: Payer: Self-pay | Admitting: Student in an Organized Health Care Education/Training Program

## 2021-11-18 ENCOUNTER — Other Ambulatory Visit: Payer: Self-pay

## 2021-11-18 DIAGNOSIS — Z79899 Other long term (current) drug therapy: Secondary | ICD-10-CM

## 2021-11-18 DIAGNOSIS — Y92009 Unspecified place in unspecified non-institutional (private) residence as the place of occurrence of the external cause: Secondary | ICD-10-CM

## 2021-11-18 DIAGNOSIS — K5900 Constipation, unspecified: Secondary | ICD-10-CM | POA: Diagnosis present

## 2021-11-18 DIAGNOSIS — R1084 Generalized abdominal pain: Principal | ICD-10-CM | POA: Insufficient documentation

## 2021-11-18 DIAGNOSIS — Z9049 Acquired absence of other specified parts of digestive tract: Secondary | ICD-10-CM

## 2021-11-18 DIAGNOSIS — G43909 Migraine, unspecified, not intractable, without status migrainosus: Secondary | ICD-10-CM | POA: Diagnosis present

## 2021-11-18 DIAGNOSIS — K529 Noninfective gastroenteritis and colitis, unspecified: Principal | ICD-10-CM | POA: Diagnosis present

## 2021-11-18 DIAGNOSIS — Z793 Long term (current) use of hormonal contraceptives: Secondary | ICD-10-CM

## 2021-11-18 DIAGNOSIS — Z8249 Family history of ischemic heart disease and other diseases of the circulatory system: Secondary | ICD-10-CM

## 2021-11-18 DIAGNOSIS — K219 Gastro-esophageal reflux disease without esophagitis: Secondary | ICD-10-CM | POA: Diagnosis present

## 2021-11-18 DIAGNOSIS — R112 Nausea with vomiting, unspecified: Secondary | ICD-10-CM | POA: Diagnosis present

## 2021-11-18 DIAGNOSIS — X19XXXA Contact with other heat and hot substances, initial encounter: Secondary | ICD-10-CM | POA: Diagnosis present

## 2021-11-18 DIAGNOSIS — E860 Dehydration: Secondary | ICD-10-CM | POA: Diagnosis present

## 2021-11-18 DIAGNOSIS — K3189 Other diseases of stomach and duodenum: Secondary | ICD-10-CM | POA: Diagnosis present

## 2021-11-18 DIAGNOSIS — T2112XA Burn of first degree of abdominal wall, initial encounter: Secondary | ICD-10-CM | POA: Diagnosis present

## 2021-11-18 DIAGNOSIS — K269 Duodenal ulcer, unspecified as acute or chronic, without hemorrhage or perforation: Secondary | ICD-10-CM | POA: Diagnosis present

## 2021-11-18 DIAGNOSIS — F419 Anxiety disorder, unspecified: Secondary | ICD-10-CM | POA: Diagnosis present

## 2021-11-18 DIAGNOSIS — R000 Tachycardia, unspecified: Secondary | ICD-10-CM | POA: Diagnosis present

## 2021-11-18 LAB — LIPASE, BLOOD: Lipase: 32 U/L (ref 11–51)

## 2021-11-18 LAB — CBC
HCT: 39 % (ref 36.0–46.0)
Hemoglobin: 12.9 g/dL (ref 12.0–15.0)
MCH: 29.9 pg (ref 26.0–34.0)
MCHC: 33.1 g/dL (ref 30.0–36.0)
MCV: 90.3 fL (ref 80.0–100.0)
Platelets: 211 10*3/uL (ref 150–400)
RBC: 4.32 MIL/uL (ref 3.87–5.11)
RDW: 13 % (ref 11.5–15.5)
WBC: 11.1 10*3/uL — ABNORMAL HIGH (ref 4.0–10.5)
nRBC: 0 % (ref 0.0–0.2)

## 2021-11-18 LAB — COMPREHENSIVE METABOLIC PANEL
ALT: 15 U/L (ref 0–44)
AST: 17 U/L (ref 15–41)
Albumin: 3.5 g/dL (ref 3.5–5.0)
Alkaline Phosphatase: 57 U/L (ref 38–126)
Anion gap: 5 (ref 5–15)
BUN: 17 mg/dL (ref 6–20)
CO2: 30 mmol/L (ref 22–32)
Calcium: 9.3 mg/dL (ref 8.9–10.3)
Chloride: 98 mmol/L (ref 98–111)
Creatinine, Ser: 0.78 mg/dL (ref 0.44–1.00)
GFR, Estimated: >60 mL/min (ref >60)
Glucose, Bld: 99 mg/dL (ref 70–99)
Potassium: 3.3 mmol/L — ABNORMAL LOW (ref 3.5–5.1)
Sodium: 133 mmol/L — ABNORMAL LOW (ref 135–145)
Total Bilirubin: 1.3 mg/dL — ABNORMAL HIGH (ref 0.3–1.2)
Total Protein: 8 g/dL (ref 6.5–8.1)

## 2021-11-18 LAB — URINALYSIS, ROUTINE W REFLEX MICROSCOPIC
Bilirubin Urine: NEGATIVE
Glucose, UA: NEGATIVE mg/dL
Ketones, ur: 20 mg/dL — AB
Leukocytes,Ua: NEGATIVE
Nitrite: NEGATIVE
Protein, ur: 100 mg/dL — AB
Specific Gravity, Urine: 1.034 — ABNORMAL HIGH (ref 1.005–1.030)
pH: 5 (ref 5.0–8.0)

## 2021-11-18 LAB — HEPATITIS PANEL, ACUTE
HCV Ab: NONREACTIVE
Hep A IgM: NONREACTIVE
Hep B C IgM: NONREACTIVE
Hepatitis B Surface Ag: NONREACTIVE

## 2021-11-18 LAB — LACTIC ACID, PLASMA
Lactic Acid, Venous: 0.9 mmol/L (ref 0.5–1.9)
Lactic Acid, Venous: 0.8 mmol/L (ref 0.5–1.9)

## 2021-11-18 LAB — POC URINE PREG, ED: Preg Test, Ur: NEGATIVE

## 2021-11-18 LAB — HIV ANTIBODY (ROUTINE TESTING W REFLEX): HIV Screen 4th Generation wRfx: NONREACTIVE

## 2021-11-18 MED ORDER — BISACODYL 5 MG PO TBEC: 5.0000 mg | DELAYED_RELEASE_TABLET | Freq: Every day | ORAL | Status: DC | PRN

## 2021-11-18 MED ORDER — LITHIUM CARBONATE 300 MG PO CAPS
600.0000 mg | ORAL_CAPSULE | Freq: Every day | ORAL | Status: DC
  Administered 2021-11-18 – 2021-11-21 (×4): 600 mg via ORAL
  Filled 2021-11-18 (×5): qty 2

## 2021-11-18 MED ORDER — LACTATED RINGERS IV SOLN: INTRAVENOUS | Status: DC

## 2021-11-18 MED ORDER — SODIUM CHLORIDE 0.9 % IV SOLN
1.0000 g | Freq: Once | INTRAVENOUS | Status: AC
  Administered 2021-11-18: 1 g via INTRAVENOUS
  Filled 2021-11-18: qty 10

## 2021-11-18 MED ORDER — ACETAMINOPHEN 650 MG RE SUPP: 650.0000 mg | Freq: Four times a day (QID) | RECTAL | Status: DC | PRN

## 2021-11-18 MED ORDER — METOCLOPRAMIDE HCL 5 MG/ML IJ SOLN
10.0000 mg | Freq: Once | INTRAMUSCULAR | Status: AC
  Administered 2021-11-18: 10 mg via INTRAVENOUS
  Filled 2021-11-18: qty 2

## 2021-11-18 MED ORDER — MORPHINE SULFATE (PF) 4 MG/ML IV SOLN
4.0000 mg | INTRAVENOUS | Status: DC | PRN
  Administered 2021-11-18: 4 mg via INTRAVENOUS
  Filled 2021-11-18: qty 1

## 2021-11-18 MED ORDER — IOHEXOL 300 MG/ML  SOLN
100.0000 mL | Freq: Once | INTRAMUSCULAR | Status: AC | PRN
  Administered 2021-11-18: 100 mL via INTRAVENOUS

## 2021-11-18 MED ORDER — ACETAMINOPHEN 10 MG/ML IV SOLN
1000.0000 mg | Freq: Four times a day (QID) | INTRAVENOUS | Status: AC | PRN
  Administered 2021-11-18 – 2021-11-19 (×2): 1000 mg via INTRAVENOUS
  Filled 2021-11-18 (×2): qty 100

## 2021-11-18 MED ORDER — LACTATED RINGERS IV BOLUS
1000.0000 mL | Freq: Once | INTRAVENOUS | Status: AC
  Administered 2021-11-18: 1000 mL via INTRAVENOUS

## 2021-11-18 MED ORDER — ONDANSETRON 4 MG PO TBDP: 4.0000 mg | ORAL_TABLET | Freq: Three times a day (TID) | ORAL | Status: DC | PRN

## 2021-11-18 MED ORDER — SUMATRIPTAN SUCCINATE 50 MG PO TABS: 50.0000 mg | ORAL_TABLET | Freq: Once | ORAL | Status: DC | PRN

## 2021-11-18 MED ORDER — POLYETHYLENE GLYCOL 3350 17 G PO PACK
17.0000 g | PACK | Freq: Every day | ORAL | Status: DC
  Administered 2021-11-20 – 2021-11-22 (×3): 17 g via ORAL
  Filled 2021-11-18 (×3): qty 1

## 2021-11-18 MED ORDER — METRONIDAZOLE 500 MG/100ML IV SOLN
500.0000 mg | Freq: Once | INTRAVENOUS | Status: AC
  Administered 2021-11-18: 500 mg via INTRAVENOUS
  Filled 2021-11-18: qty 100

## 2021-11-18 MED ORDER — ACETAMINOPHEN 325 MG PO TABS
650.0000 mg | ORAL_TABLET | Freq: Four times a day (QID) | ORAL | Status: DC | PRN
  Administered 2021-11-20: 650 mg via ORAL
  Filled 2021-11-18 (×2): qty 2

## 2021-11-18 MED ORDER — QUETIAPINE FUMARATE 25 MG PO TABS
25.0000 mg | ORAL_TABLET | Freq: Every day | ORAL | Status: DC
  Administered 2021-11-18 – 2021-11-21 (×4): 25 mg via ORAL
  Filled 2021-11-18 (×4): qty 1

## 2021-11-18 MED ORDER — HYDROMORPHONE HCL 1 MG/ML IJ SOLN
0.5000 mg | INTRAMUSCULAR | Status: DC | PRN
  Administered 2021-11-18 – 2021-11-19 (×3): 0.5 mg via INTRAVENOUS
  Filled 2021-11-18 (×4): qty 1

## 2021-11-18 MED ORDER — ALBUTEROL SULFATE (2.5 MG/3ML) 0.083% IN NEBU: 3.0000 mL | INHALATION_SOLUTION | RESPIRATORY_TRACT | Status: DC | PRN

## 2021-11-18 MED ORDER — ONDANSETRON HCL 4 MG/2ML IJ SOLN
4.0000 mg | Freq: Four times a day (QID) | INTRAMUSCULAR | Status: DC | PRN
  Administered 2021-11-18 – 2021-11-21 (×7): 4 mg via INTRAVENOUS
  Filled 2021-11-18 (×8): qty 2

## 2021-11-18 MED ORDER — ACETAMINOPHEN 325 MG PO TABS
650.0000 mg | ORAL_TABLET | Freq: Once | ORAL | Status: AC
  Administered 2021-11-18: 650 mg via ORAL
  Filled 2021-11-18: qty 2

## 2021-11-18 NOTE — ED Triage Notes (Signed)
Pt here with abd pain that started Wed and was seen in the ED. Pt states the pain is still there and now she cannot move without everything hurting. Pt having NVD.

## 2021-11-18 NOTE — ED Provider Notes (Signed)
[facility] Provider Note    Event Date/Time   First MD Initiated Contact with Patient 11/18/21 1339     (approximate)   History   Abdominal Pain   HPI  Erin Joseph is a 21 y.o. female presents to the ER for evaluation of progressively worsening generalized abdominal pain crampy in nature, intractable nausea vomiting.  Has been unable to keep anything down for the past 24 hours.  Was seen here few days ago for similar symptoms had reassuring work-up diagnosed with enteritis and discharged home with symptomatic management.  She denies any dysuria no vaginal bleeding no discharge.  Denies any chance of pregnancy.  Has been having hot and cold chills.  Denies any history of IBD.     Physical Exam   Triage Vital Signs: ED Triage Vitals  Enc Vitals Group     BP 11/18/21 1312 (!) 119/49     Pulse Rate 11/18/21 1312 (!) 116     Resp 11/18/21 1312 18     Temp 11/18/21 1312 99.4 F (37.4 C)     Temp Source 11/18/21 1312 Oral     SpO2 11/18/21 1312 97 %     Weight 11/18/21 1315 171 lb 1.2 oz (77.6 kg)     Height 11/18/21 1315 5\' 1"  (1.549 m)     Head Circumference --      Peak Flow --      Pain Score 11/18/21 1315 9     Pain Loc --      Pain Edu? --      Excl. in GC? --     Most recent vital signs: Vitals:   11/18/21 1315 11/18/21 1415  BP: (!) 119/49   Pulse: (!) 114   Resp: 18   Temp: 99.4 F (37.4 C) (!) 101.1 F (38.4 C)  SpO2: 97%      Constitutional: Alert  Eyes: Conjunctivae are normal.  Head: Atraumatic. Nose: No congestion/rhinnorhea. Mouth/Throat: Mucous membranes are moist.   Neck: Painless ROM.  Cardiovascular:   Good peripheral circulation. Respiratory: Normal respiratory effort.  No retractions.  Gastrointestinal: Diffuse tenderness to palpation no guarding or rebound. Musculoskeletal:  no deformity Neurologic:  MAE spontaneously. No gross focal neurologic deficits are appreciated.  Skin:  Skin is warm, dry and intact.  No rash noted. Psychiatric: Mood and affect are normal. Speech and behavior are normal.    ED Results / Procedures / Treatments   Labs (all labs ordered are listed, but only abnormal results are displayed) Labs Reviewed  COMPREHENSIVE METABOLIC PANEL - Abnormal; Notable for the following components:      Result Value   Sodium 133 (*)    Potassium 3.3 (*)    Total Bilirubin 1.3 (*)    All other components within normal limits  CBC - Abnormal; Notable for the following components:   WBC 11.1 (*)    All other components within normal limits  URINALYSIS, ROUTINE W REFLEX MICROSCOPIC - Abnormal; Notable for the following components:   Color, Urine AMBER (*)    APPearance HAZY (*)    Specific Gravity, Urine 1.034 (*)    Hgb urine dipstick SMALL (*)    Ketones, ur 20 (*)    Protein, ur 100 (*)    Bacteria, UA RARE (*)    All other components within normal limits  CULTURE, BLOOD (ROUTINE X 2)  CULTURE, BLOOD (ROUTINE X 2)  GASTROINTESTINAL PANEL BY PCR, STOOL (REPLACES STOOL CULTURE)  LIPASE, BLOOD  LACTIC  ACID, PLASMA  LACTIC ACID, PLASMA  HIV ANTIBODY (ROUTINE TESTING W REFLEX)  HEPATITIS PANEL, ACUTE  CBC  COMPREHENSIVE METABOLIC PANEL  POC URINE PREG, ED     EKG     RADIOLOGY Please see ED Course for my review and interpretation.  I personally reviewed all radiographic images ordered to evaluate for the above acute complaints and reviewed radiology reports and findings.  These findings were personally discussed with the patient.  Please see medical record for radiology report.    PROCEDURES:  Critical Care performed:   Procedures   MEDICATIONS ORDERED IN ED: Medications  cefTRIAXone (ROCEPHIN) 1 g in sodium chloride 0.9 % 100 mL IVPB (1 g Intravenous New Bag/Given 11/18/21 1659)  metroNIDAZOLE (FLAGYL) IVPB 500 mg (500 mg Intravenous New Bag/Given 11/18/21 1659)  lactated ringers infusion (has no administration in time range)  acetaminophen (OFIRMEV) IV  1,000 mg (has no administration in time range)  ondansetron (ZOFRAN) injection 4 mg (has no administration in time range)  ondansetron (ZOFRAN-ODT) disintegrating tablet 4 mg (has no administration in time range)  SUMAtriptan (IMITREX) tablet 50 mg (has no administration in time range)  acetaminophen (TYLENOL) tablet 650 mg (has no administration in time range)    Or  acetaminophen (TYLENOL) suppository 650 mg (has no administration in time range)  polyethylene glycol (MIRALAX / GLYCOLAX) packet 17 g (has no administration in time range)  bisacodyl (DULCOLAX) EC tablet 5 mg (has no administration in time range)  HYDROmorphone (DILAUDID) injection 0.5 mg (has no administration in time range)  lithium carbonate capsule 600 mg (has no administration in time range)  QUEtiapine (SEROQUEL) tablet 25 mg (has no administration in time range)  albuterol (PROVENTIL) (2.5 MG/3ML) 0.083% nebulizer solution 3 mL (has no administration in time range)  lactated ringers bolus 1,000 mL (0 mLs Intravenous Stopped 11/18/21 1646)  metoCLOPramide (REGLAN) injection 10 mg (10 mg Intravenous Given 11/18/21 1358)  acetaminophen (TYLENOL) tablet 650 mg (650 mg Oral Given 11/18/21 1432)  iohexol (OMNIPAQUE) 300 MG/ML solution 100 mL (100 mLs Intravenous Contrast Given 11/18/21 1502)     IMPRESSION / MDM / ASSESSMENT AND PLAN / ED COURSE  I reviewed the triage vital signs and the nursing notes.                              Differential diagnosis includes, but is not limited to, colitis, enteritis, IBD, appendicitis, pyelonephritis, cystitis, dehydration, SBO,  Patient presenting to the ER for evaluation of symptoms as described above.  Based on symptoms, risk factors and considered above differential, this presenting complaint could reflect a potentially life-threatening illness therefore the patient will be placed on continuous pulse oximetry and telemetry for monitoring.  Laboratory evaluation will be sent to  evaluate for the above complaints.  I will order IV fluids as well as IV narcotic pain medication and IV antibiotics.  She does have tenderness on exam.  Will order KUB to evaluate for obstructive pattern given her recurrent vomiting.  If negative may consider repeat CT imaging.   Clinical Course as of 11/18/21 1706  Fri Nov 18, 2021  1452 Urinalysis without evidence of clear infection.  It is concentrated.  Given her fever and worsening abdominal pain will repeat CT imaging. [PR]  5784 CT imaging on my review and interpretation does show evidence of inflammation of the small bowel will await formal radiology report. [PR]  1558 Case discussed in consultation with hospitalist who agreed  admit patient to their service for further evaluation. [PR]    Clinical Course User Index [PR] Willy Eddy, MD     FINAL CLINICAL IMPRESSION(S) / ED DIAGNOSES   Final diagnoses:  Generalized abdominal pain  Enteritis  Nausea and vomiting, unspecified vomiting type     Rx / DC Orders   ED Discharge Orders     None        Note:  This document was prepared using Dragon voice recognition software and may include unintentional dictation errors.    Willy Eddy, MD 11/18/21 [phone]
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
## 2021-11-19 DIAGNOSIS — K529 Noninfective gastroenteritis and colitis, unspecified: Secondary | ICD-10-CM | POA: Diagnosis present

## 2021-11-19 DIAGNOSIS — R1084 Generalized abdominal pain: Secondary | ICD-10-CM | POA: Diagnosis not present

## 2021-11-19 DIAGNOSIS — Z79899 Other long term (current) drug therapy: Secondary | ICD-10-CM | POA: Diagnosis not present

## 2021-11-19 DIAGNOSIS — Z9049 Acquired absence of other specified parts of digestive tract: Secondary | ICD-10-CM | POA: Diagnosis not present

## 2021-11-19 DIAGNOSIS — K269 Duodenal ulcer, unspecified as acute or chronic, without hemorrhage or perforation: Secondary | ICD-10-CM | POA: Diagnosis present

## 2021-11-19 DIAGNOSIS — K3189 Other diseases of stomach and duodenum: Secondary | ICD-10-CM | POA: Diagnosis present

## 2021-11-19 DIAGNOSIS — R112 Nausea with vomiting, unspecified: Secondary | ICD-10-CM | POA: Diagnosis not present

## 2021-11-19 DIAGNOSIS — K5900 Constipation, unspecified: Secondary | ICD-10-CM | POA: Diagnosis present

## 2021-11-19 DIAGNOSIS — E860 Dehydration: Secondary | ICD-10-CM | POA: Diagnosis present

## 2021-11-19 DIAGNOSIS — Y92009 Unspecified place in unspecified non-institutional (private) residence as the place of occurrence of the external cause: Secondary | ICD-10-CM | POA: Diagnosis not present

## 2021-11-19 DIAGNOSIS — X19XXXA Contact with other heat and hot substances, initial encounter: Secondary | ICD-10-CM | POA: Diagnosis present

## 2021-11-19 DIAGNOSIS — K219 Gastro-esophageal reflux disease without esophagitis: Secondary | ICD-10-CM | POA: Diagnosis present

## 2021-11-19 DIAGNOSIS — R000 Tachycardia, unspecified: Secondary | ICD-10-CM | POA: Diagnosis present

## 2021-11-19 DIAGNOSIS — Z793 Long term (current) use of hormonal contraceptives: Secondary | ICD-10-CM | POA: Diagnosis not present

## 2021-11-19 DIAGNOSIS — T2112XA Burn of first degree of abdominal wall, initial encounter: Secondary | ICD-10-CM | POA: Diagnosis present

## 2021-11-19 DIAGNOSIS — F419 Anxiety disorder, unspecified: Secondary | ICD-10-CM | POA: Diagnosis present

## 2021-11-19 DIAGNOSIS — G43909 Migraine, unspecified, not intractable, without status migrainosus: Secondary | ICD-10-CM | POA: Diagnosis present

## 2021-11-19 DIAGNOSIS — Z8249 Family history of ischemic heart disease and other diseases of the circulatory system: Secondary | ICD-10-CM | POA: Diagnosis not present

## 2021-11-19 LAB — COMPREHENSIVE METABOLIC PANEL
ALT: 13 U/L (ref 0–44)
AST: 12 U/L — ABNORMAL LOW (ref 15–41)
Albumin: 2.7 g/dL — ABNORMAL LOW (ref 3.5–5.0)
Alkaline Phosphatase: 44 U/L (ref 38–126)
Anion gap: 9 (ref 5–15)
BUN: 13 mg/dL (ref 6–20)
CO2: 25 mmol/L (ref 22–32)
Calcium: 8.5 mg/dL — ABNORMAL LOW (ref 8.9–10.3)
Chloride: 99 mmol/L (ref 98–111)
Creatinine, Ser: 0.68 mg/dL (ref 0.44–1.00)
GFR, Estimated: >60 mL/min (ref >60)
Glucose, Bld: 83 mg/dL (ref 70–99)
Potassium: 3.3 mmol/L — ABNORMAL LOW (ref 3.5–5.1)
Sodium: 133 mmol/L — ABNORMAL LOW (ref 135–145)
Total Bilirubin: 1.2 mg/dL (ref 0.3–1.2)
Total Protein: 6.7 g/dL (ref 6.5–8.1)

## 2021-11-19 LAB — GASTROINTESTINAL PANEL BY PCR, STOOL (REPLACES STOOL CULTURE)

## 2021-11-19 LAB — CBC
HCT: 33.1 % — ABNORMAL LOW (ref 36.0–46.0)
Hemoglobin: 10.9 g/dL — ABNORMAL LOW (ref 12.0–15.0)
MCH: 30.3 pg (ref 26.0–34.0)
MCHC: 32.9 g/dL (ref 30.0–36.0)
MCV: 91.9 fL (ref 80.0–100.0)
Platelets: 187 10*3/uL (ref 150–400)
RBC: 3.6 MIL/uL — ABNORMAL LOW (ref 3.87–5.11)
RDW: 13.1 % (ref 11.5–15.5)
WBC: 7.5 10*3/uL (ref 4.0–10.5)
nRBC: 0 % (ref 0.0–0.2)

## 2021-11-19 MED ORDER — METRONIDAZOLE 500 MG/100ML IV SOLN
500.0000 mg | Freq: Two times a day (BID) | INTRAVENOUS | Status: DC
  Administered 2021-11-19 – 2021-11-22 (×6): 500 mg via INTRAVENOUS
  Filled 2021-11-19 (×7): qty 100

## 2021-11-19 MED ORDER — SIMETHICONE 80 MG PO CHEW
160.0000 mg | CHEWABLE_TABLET | Freq: Once | ORAL | Status: AC
  Administered 2021-11-19: 160 mg via ORAL
  Filled 2021-11-19: qty 2

## 2021-11-19 MED ORDER — DICYCLOMINE HCL 10 MG PO CAPS
10.0000 mg | ORAL_CAPSULE | Freq: Once | ORAL | Status: AC
  Administered 2021-11-19: 10 mg via ORAL
  Filled 2021-11-19: qty 1

## 2021-11-19 MED ORDER — SODIUM CHLORIDE 0.9 % IV SOLN
2.0000 g | INTRAVENOUS | Status: DC
  Administered 2021-11-19 – 2021-11-21 (×3): 2 g via INTRAVENOUS
  Filled 2021-11-19 (×3): qty 2

## 2021-11-19 MED ORDER — KETOROLAC TROMETHAMINE 30 MG/ML IJ SOLN
30.0000 mg | Freq: Once | INTRAMUSCULAR | Status: AC
  Administered 2021-11-19: 30 mg via INTRAVENOUS
  Filled 2021-11-19: qty 1

## 2021-11-19 MED ORDER — HYDROMORPHONE HCL 1 MG/ML IJ SOLN
0.5000 mg | INTRAMUSCULAR | Status: AC | PRN
  Administered 2021-11-19 (×3): 0.5 mg via INTRAVENOUS
  Filled 2021-11-19 (×3): qty 1

## 2021-11-19 NOTE — Plan of Care (Signed)
  Problem: Clinical Measurements: Goal: Cardiovascular complication will be avoided Outcome: Progressing   Problem: Activity: Goal: Risk for activity intolerance will decrease Outcome: Progressing   Problem: Nutrition: Goal: Adequate nutrition will be maintained Outcome: Not Progressing   Problem: Coping: Goal: Level of anxiety will decrease Outcome: Progressing   Problem: Elimination: Goal: Will not experience complications related to urinary retention Outcome: Progressing   Problem: Pain Managment: Goal: General experience of comfort will improve Outcome: Not Progressing   Problem: Safety: Goal: Ability to remain free from injury will improve Outcome: Progressing

## 2021-11-19 NOTE — Progress Notes (Signed)
PROGRESS NOTE  Erin Joseph    DOB: 07/02/2000, 21 y.o.  TLX:[id number]    Code Status: Full Code   DOA: 11/18/2021   LOS: 0   Brief hospital course  Erin Joseph is a 21 y.o. female with a PMH significant for migraines.  They presented from home to the ED on 11/18/2021 with intractable N/V/abdominal pain x 3 days.  In the ED, she was found to have a fever as high as 101.1, tachycardia as high as 116 and otherwise normal vital signs. Labs were significant for negative lipase, kidney function within normal limits, Na+ 133, K+ 3.3, glucose 99, WBC 11.1, otherwise normal CBC, lactic acid, blood cultures, GI panel pending. Urinalysis positive for small hemoglobin, specific gravity 1.034 , 20 ketones, 100 protein, negative nitrates or leukocytes, rare bacteria, 11-20 squamous epithelial cells.  Picture is most consistent with dehydration.  Urine pregnancy test negative.   CT abdomen pelvis positive for signs of inflammation of the small bowel consistent with enteritis without obstruction.  Terminal ileum and appendix are read as normal.   They were initially treated with Tylenol, Reglan, LR bolus, morphine, ceftriaxone, Flagyl  Patient was admitted to medicine service for further workup and management of intractable N/V/abdominal pain as outlined in detail below.  11/19/21 -stable, unimproved, per pt  Assessment & Plan  Principal Problem:   Intractable nausea and vomiting  Intractable nausea, vomiting Enteritis-as seen on abdominal CT which is significantly worsened since prior study 2 days ago-diarrhea has resolved spontaneously at this time.  Most likely infectious etiology.  Viral versus bacterial. Hepatitis less likely given normal liver enzymes.  Less likely GU infection given recent negative STI testing and no vaginal symptoms.  Can consider IBD if symptoms do not resolve infectious agent not identified. - deescalate Abx once Bx Cx and/or GI panel indicates not necessary  - GI panel  negative, acute hepatitis panel negative.  - continue supportive care including IVF -Tylenol IV, p.o. as needed -Zofran IV, p.o.  As needed -Heating pads -Monitor fever curve   H/o migraines-currently asymptomatic -Home sumatriptan ordered.  Body mass index is 32.32 kg/m.  VTE ppx: ambulatory  Diet:     Diet   Diet regular Room service appropriate? Yes; Fluid consistency: Thin   Consultants: none Subjective 11/19/21    Pt reports continued abdominal pain. Nursing report that she was sleeping well over the morning. She states she does not see improvement but has not had any vomiting.  She had 1 BM today.   Objective   Vitals:   11/18/21 1415 11/18/21 1729 11/18/21 2209 11/18/21 2341  BP:  126/78 (!) 108/55 109/64  Pulse:  99  80  Resp:   20 17  Temp: (!) 101.1 F (38.4 C) 99.6 F (37.6 C) 98.5 F (36.9 C) 98.3 F (36.8 C)  TempSrc: Oral  Axillary   SpO2:  98% 97% 94%  Weight:      Height:        Intake/Output Summary (Last 24 hours) at 11/19/2021 0757 Last data filed at 11/19/2021 0604 Gross per 24 hour  Intake 1194.2 ml  Output 1 ml  Net 1193.2 ml   Filed Weights   11/18/21 1315  Weight: 77.6 kg     Physical Exam:  General: awake, alert, acute distress Respiratory: normal respiratory effort. Cardiovascular: quick capillary refill Gastrointestinal: soft, ND. No rebound or guarding but endorses diffuse pain throughout entire abdomen. Laying flat on the bed Nervous: tearful, anxious Extremities: moves all equally,  no edema, normal tone Skin: erythematous abdomen  Labs   I have personally reviewed the following labs and imaging studies CBC    Component Value Date/Time   WBC 7.5 11/19/2021 0509   RBC 3.60 (L) 11/19/2021 0509   HGB 10.9 (L) 11/19/2021 0509   HGB 13.4 03/27/2013 2224   HCT 33.1 (L) 11/19/2021 0509   HCT 38.7 03/27/2013 2224   PLT 187 11/19/2021 0509   PLT 197 03/27/2013 2224   MCV 91.9 11/19/2021 0509   MCV 86 03/27/2013 2224    MCH 30.3 11/19/2021 0509   MCHC 32.9 11/19/2021 0509   RDW 13.1 11/19/2021 0509   RDW 13.0 03/27/2013 2224   LYMPHSABS 3.1 12/03/2020 1733   LYMPHSABS 3.5 03/27/2013 2224   MONOABS 0.7 12/03/2020 1733   MONOABS 0.6 03/27/2013 2224   EOSABS 0.1 12/03/2020 1733   EOSABS 0.1 03/27/2013 2224   BASOSABS 0.1 12/03/2020 1733   BASOSABS 0.0 03/27/2013 2224      Latest Ref Rng & Units 11/19/2021    5:09 AM 11/18/2021    1:17 PM 11/16/2021    2:32 PM  BMP  Glucose 70 - 99 mg/dL 83  99  123   BUN 6 - 20 mg/dL 13  17  14    Creatinine 0.44 - 1.00 mg/dL 0.68  0.78  0.76   Sodium 135 - 145 mmol/L 133  133  137   Potassium 3.5 - 5.1 mmol/L 3.3  3.3  3.6   Chloride 98 - 111 mmol/L 99  98  106   CO2 22 - 32 mmol/L 25  30  22    Calcium 8.9 - 10.3 mg/dL 8.5  9.3  9.3     CT ABDOMEN PELVIS W CONTRAST  Result Date: 11/18/2021 CLINICAL DATA:  Abdominal pain, acute nonlocalized. EXAM: CT ABDOMEN AND PELVIS WITH CONTRAST TECHNIQUE: Multidetector CT imaging of the abdomen and pelvis was performed using the standard protocol following bolus administration of intravenous contrast. RADIATION DOSE REDUCTION: This exam was performed according to the departmental dose-optimization program which includes automated exposure control, adjustment of the mA and/or kV according to patient size and/or use of iterative reconstruction technique. CONTRAST:  19mL OMNIPAQUE IOHEXOL 300 MG/ML  SOLN COMPARISON:  None Available. FINDINGS: Lower chest: Lung bases are clear. Hepatobiliary: No focal hepatic lesion. Postcholecystectomy. No biliary dilatation. Pancreas: Pancreas is normal. No ductal dilatation. No pancreatic inflammation. Spleen: Normal spleen Adrenals/urinary tract: Adrenal glands and kidneys are normal. The ureters and bladder normal. Stomach/Bowel: Stomach is normal volume. Duodenum normal caliber. There is fluid in the proximal small bowel with mild dilatation up to 2.6 cm (image 41/2). There is mild mucosal  enhancement (image 49/coronal/series 5. There is some edema in the mesentery. This small bowel mucosal enhancement extends in pelvis (image 75/2). The terminal ileum leading up to the ileocecal valve is reduced in caliber; however, no evidence of high-grade obstruction. Normal appendix (image 61/2). There is small amount of free fluid in the pelvis adjacent to the cecum (image 77/2). Fluid along the pericolic gutters additionally. Ascending and transverse colon normal. The colon is devoid of stool collapsed. Rectosigmoid colon normal. Intraperitoneal free fluid is small bowel inflammation or progressed from exam 2 days prior. No intraperitoneal free air.  No pneumatosis or portal venous gas. Vascular/Lymphatic: Abdominal aorta is normal caliber. No periportal or retroperitoneal adenopathy. No pelvic adenopathy. Celiac trunk and SMA are patent. Reproductive: Uterus and adnexa unremarkable. Other: No intraperitoneal free air. Musculoskeletal: No aggressive osseous lesion. IMPRESSION: 1. Long  segment fluid-filled small bowel with mucosal enhancement. Findings favor enteritis over bowel obstruction. Terminal ileum is normal. Differential for enteritis would include infectious enteritis, inflammatory bowel disease, or collagen vascular disease. Findings significantly progressed from exam 2 days prior. Recommend GI consultation. 2. Small a moderate volume of free fluid along the pericolic gutters and in the pelvis related to above described small-bowel abnormality. 3. Normal appendix. Electronically Signed   By: Suzy Bouchard M.D.   On: 11/18/2021 15:36   DG Abdomen 1 View  Result Date: 11/18/2021 CLINICAL DATA:  n/v eval for obstructive pattern EXAM: ABDOMEN - 1 VIEW COMPARISON:  CT 11/16/2021 FINDINGS: There is a mildly dilated small bowel loop in the lower abdomen. There is a dilated loop of bowel in the pelvis favored to be the cecum or sigmoid colon. Right upper quadrant surgical clips. Mild stool burden.  IMPRESSION: Mildly dilated loop of small bowel in the lower abdomen and the loop of bowel in the pelvis favored to be the cecum. This is nonspecific and could reflect an infectious/inflammatory process, early partial bowel obstruction or reactive ileus is possible. Electronically Signed   By: Maurine Simmering M.D.   On: 11/18/2021 14:27    Disposition Plan & Communication  Patient status: Observation  Admitted From: Home Planned disposition location: Home Anticipated discharge date: 10/29 pending clinical improvement  Family Communication: father on phone with pt at time of encounter    Author: Richarda Osmond, DO [facility] 11/19/2021, 7:57 AM   Available by Epic secure chat 7AM-7PM. If 7PM-7AM, please contact night-coverage.  [facility] contact information found on [email or web address].

## 2021-11-20 DIAGNOSIS — R112 Nausea with vomiting, unspecified: Secondary | ICD-10-CM

## 2021-11-20 DIAGNOSIS — R1084 Generalized abdominal pain: Secondary | ICD-10-CM

## 2021-11-20 DIAGNOSIS — K529 Noninfective gastroenteritis and colitis, unspecified: Secondary | ICD-10-CM | POA: Diagnosis not present

## 2021-11-20 MED ORDER — HYDROCODONE-ACETAMINOPHEN 5-325 MG PO TABS
1.0000 | ORAL_TABLET | Freq: Four times a day (QID) | ORAL | Status: DC | PRN
  Administered 2021-11-20 – 2021-11-21 (×4): 1 via ORAL
  Filled 2021-11-20 (×4): qty 1

## 2021-11-20 MED ORDER — BACID PO TABS: 2.0000 | ORAL_TABLET | Freq: Two times a day (BID) | ORAL | Status: DC

## 2021-11-20 MED ORDER — RISAQUAD PO CAPS
2.0000 | ORAL_CAPSULE | Freq: Every day | ORAL | Status: DC
  Administered 2021-11-20 – 2021-11-22 (×3): 2 via ORAL
  Filled 2021-11-20 (×3): qty 2

## 2021-11-20 NOTE — Discharge Summary (Signed)
Physician Discharge Summary  Patient: Erin Joseph [id number]:[id number] DOB: 07-13-2000   Code Status: Prior Admit date: 11/18/2021 Discharge date: 11/22/2021 Disposition: Home, No home health services recommended PCP: System, Provider Not In  Recommendations for Outpatient Follow-up:  Follow up with PCP within 1-2 weeks to discuss EGD biopsy results, address chronic nausea/vomiting.  Follow up with GI as needed  Discharge Diagnoses:  Principal Problem:   Intractable nausea and vomiting  Brief Hospital Course Summary: Erin Joseph is a 21 y.o. female with a PMH significant for migraines, chronic nausea/vomiting.   They presented from home to the ED on 11/18/2021 with intractable N/V/abdominal pain x 3 days after eating [employer].   In the ED, she was found to have a fever as high as 101.1, tachycardia as high as 116 and otherwise normal vital signs. Labs were significant for negative lipase, kidney function within normal limits, Na+ 133, K+ 3.3, glucose 99, WBC 11.1, otherwise normal CBC, lactic acid, blood cultures, GI panel pending. Urinalysis positive for small hemoglobin, specific gravity 1.034 , 20 ketones, 100 protein, negative nitrates or leukocytes, rare bacteria, 11-20 squamous epithelial cells.  Picture is most consistent with dehydration.  Urine pregnancy test negative. CT abdomen pelvis positive for signs of inflammation of the small bowel consistent with enteritis without obstruction.  Terminal ileum and appendix are read as normal. They were initially treated with Tylenol, Reglan, LR bolus, morphine, ceftriaxone, Flagyl. She was admitted and treated symptomatically for enteritis. She failed to improve with symptomatic care. GI panel returned negative. GI was consulted and performed an EGD. Exam was overall normal with exception of non-bleeding duodenal ulcers. Biopsies were obtained.  Patient did have improvement in her symptoms with supportive care including an enema for  constipation and passed a PO trial without antiemetics or pain medications.  She felt stable to be discharged home with protonix, PRN antiemetics, pain medicines. Also recommended probiotics, fiber supplement and high fiber diet. Spent extensive time discussing dietary and lifestyle recommendations including foods/substances to avoid with ulcers and good sleep hygiene.   All other chronic conditions were treated with home medications.   Discharge Condition: Good, improved Recommended discharge diet: Regular healthy diet  Consultations: GI  Procedures/Studies: EGD 10/30   Allergies as of 11/22/2021   No Known Allergies      Medication List     STOP taking these medications    clonazePAM 0.25 MG disintegrating tablet Commonly known as: KLONOPIN   famotidine 20 MG tablet Commonly known as: PEPCID   predniSONE 10 MG (21) Tbpk tablet Commonly known as: STERAPRED UNI-PAK 21 TAB   Rimegepant Sulfate 75 MG Tbdp       TAKE these medications    acetaminophen 325 MG tablet Commonly known as: TYLENOL Take 650 mg by mouth every 6 (six) hours as needed for mild pain or moderate pain.   acidophilus Caps capsule Take 2 capsules by mouth daily.   AeroChamber Plus inhaler Use with inhaler   albuterol 108 (90 Base) MCG/ACT inhaler Commonly known as: VENTOLIN HFA Inhale 1-2 puffs into the lungs every 4 (four) hours as needed for wheezing or shortness of breath.   dicyclomine 10 MG capsule Commonly known as: BENTYL Take 1 capsule (10 mg total) by mouth in the morning, at noon, and at bedtime.   HYDROcodone-acetaminophen 5-325 MG tablet Commonly known as: NORCO/VICODIN Take 1 tablet by mouth every 4 (four) hours as needed for moderate pain.   lamoTRIgine 100 MG tablet Commonly known as: LAMICTAL  Take 1 tablet (100 mg total) by mouth daily. Take along with 200mg  tablet daily for a total of 300mg    lamoTRIgine 200 MG tablet Commonly known as: LAMICTAL Take 1 tablet (200  mg total) by mouth daily. Take along with 100mg  tablet daily for a total of 300mg    lithium 600 MG capsule Take 600 mg by mouth daily.   medroxyPROGESTERone Acetate 150 MG/ML Susy Inject 1 mL (150 mg total) into the muscle once for 1 dose.   Metamucil Smooth Texture 58.6 % powder Generic drug: psyllium Take 1 packet by mouth 3 (three) times daily.   ondansetron 4 MG disintegrating tablet Commonly known as: ZOFRAN-ODT Take 1 tablet (4 mg total) by mouth every 8 (eight) hours as needed.   ondansetron 4 MG tablet Commonly known as: Zofran Take 1 tablet (4 mg total) by mouth every 8 (eight) hours as needed for nausea or vomiting.   pantoprazole 20 MG tablet Commonly known as: Protonix Take 1 tablet (20 mg total) by mouth daily.   polyethylene glycol powder 17 GM/SCOOP powder Commonly known as: GLYCOLAX/MIRALAX Take 17 g by mouth daily.   QUEtiapine 25 MG tablet Commonly known as: SEROquel Take 1 tablet (25 mg total) by mouth at bedtime.         Subjective   Pt reports feeling much better. She was able to tolerate a full breakfast without nausea and had no antiemetics prior. She endorses mild left flank tenderness but not bothering her. She had large BM last night. We discussed the predominant side effect of opioids causing constipation and recommended avoiding them if possible.  Objective  Blood pressure 117/63, pulse 77, temperature 98.8 F (37.1 C), resp. rate 16, height 5\' 1"  (1.549 m), weight 77.6 kg, SpO2 99 %.   General: Pt is alert, awake, not in acute distress Cardiovascular: RRR, S1/S2 +, no rubs, no gallops Respiratory: CTA bilaterally, no wheezing, no rhonchi Abdominal: Soft, NT, ND, bowel sounds + Extremities: no edema, no cyanosis   The results of significant diagnostics from this hospitalization (including imaging, microbiology, ancillary and laboratory) are listed below for reference.   Imaging studies: CT ABDOMEN PELVIS W CONTRAST  Result Date:  11/18/2021 CLINICAL DATA:  Abdominal pain, acute nonlocalized. EXAM: CT ABDOMEN AND PELVIS WITH CONTRAST TECHNIQUE: Multidetector CT imaging of the abdomen and pelvis was performed using the standard protocol following bolus administration of intravenous contrast. RADIATION DOSE REDUCTION: This exam was performed according to the departmental dose-optimization program which includes automated exposure control, adjustment of the mA and/or kV according to patient size and/or use of iterative reconstruction technique. CONTRAST:  OMNIPAQUE IOHEXOL 300 MG/ML  SOLN COMPARISON:  None Available. FINDINGS: Lower chest: Lung bases are clear. Hepatobiliary: No focal hepatic lesion. Postcholecystectomy. No biliary dilatation. Pancreas: Pancreas is normal. No ductal dilatation. No pancreatic inflammation. Spleen: Normal spleen Adrenals/urinary tract: Adrenal glands and kidneys are normal. The ureters and bladder normal. Stomach/Bowel: Stomach is normal volume. Duodenum normal caliber. There is fluid in the proximal small bowel with mild dilatation up to 2.6 cm (image 41/2). There is mild mucosal enhancement (image 49/coronal/series 5. There is some edema in the mesentery. This small bowel mucosal enhancement extends in pelvis (image 75/2). The terminal ileum leading up to the ileocecal valve is reduced in caliber; however, no evidence of high-grade obstruction. Normal appendix (image 61/2). There is small amount of free fluid in the pelvis adjacent to the cecum (image 77/2). Fluid along the pericolic gutters additionally. Ascending and transverse colon normal.  The colon is devoid of stool collapsed. Rectosigmoid colon normal. Intraperitoneal free fluid is small bowel inflammation or progressed from exam 2 days prior. No intraperitoneal free air.  No pneumatosis or portal venous gas. Vascular/Lymphatic: Abdominal aorta is normal caliber. No periportal or retroperitoneal adenopathy. No pelvic adenopathy. Celiac trunk and  SMA are patent. Reproductive: Uterus and adnexa unremarkable. Other: No intraperitoneal free air. Musculoskeletal: No aggressive osseous lesion. IMPRESSION: 1. Long segment fluid-filled small bowel with mucosal enhancement. Findings favor enteritis over bowel obstruction. Terminal ileum is normal. Differential for enteritis would include infectious enteritis, inflammatory bowel disease, or collagen vascular disease. Findings significantly progressed from exam 2 days prior. Recommend GI consultation. 2. Small a moderate volume of free fluid along the pericolic gutters and in the pelvis related to above described small-bowel abnormality. 3. Normal appendix. Electronically Signed   By: Suzy Bouchard M.D.   On: 11/18/2021 15:36   DG Abdomen 1 View  Result Date: 11/18/2021 CLINICAL DATA:  n/v eval for obstructive pattern EXAM: ABDOMEN - 1 VIEW COMPARISON:  CT 11/16/2021 FINDINGS: There is a mildly dilated small bowel loop in the lower abdomen. There is a dilated loop of bowel in the pelvis favored to be the cecum or sigmoid colon. Right upper quadrant surgical clips. Mild stool burden. IMPRESSION: Mildly dilated loop of small bowel in the lower abdomen and the loop of bowel in the pelvis favored to be the cecum. This is nonspecific and could reflect an infectious/inflammatory process, early partial bowel obstruction or reactive ileus is possible. Electronically Signed   By: Maurine Simmering M.D.   On: 11/18/2021 14:27   CT ABDOMEN PELVIS W CONTRAST  Result Date: 11/16/2021 CLINICAL DATA:  Mid abdominal and left lower quadrant abdominal pain with nausea and vomiting for 1 day. EXAM: CT ABDOMEN AND PELVIS WITH CONTRAST TECHNIQUE: Multidetector CT imaging of the abdomen and pelvis was performed using the standard protocol following bolus administration of intravenous contrast. RADIATION DOSE REDUCTION: This exam was performed according to the departmental dose-optimization program which includes automated exposure  control, adjustment of the mA and/or kV according to patient size and/or use of iterative reconstruction technique. CONTRAST:  176mL OMNIPAQUE IOHEXOL 300 MG/ML  SOLN COMPARISON:  Noncontrast abdominopelvic CT 09/15/2016. FINDINGS: Lower chest: Clear lung bases. No significant pleural or pericardial effusion. Hepatobiliary: The liver is normal in density without suspicious focal abnormality. No biliary dilatation post cholecystectomy. Pancreas: Unremarkable. No pancreatic ductal dilatation or surrounding inflammatory changes. Spleen: Normal in size without focal abnormality. Adrenals/Urinary Tract: Both adrenal glands appear normal. No evidence of urinary tract calculus, suspicious renal lesion or hydronephrosis. The bladder appears unremarkable for its degree of distention. Stomach/Bowel: No enteric contrast administered. The stomach appears unremarkable for its degree of distention. Possible mild diffuse small bowel wall thickening without distention or surrounding inflammation. The proximal colon is fluid-filled without significant distension, wall thickening or surrounding inflammation. The appendix appears normal. The distal colon is decompressed. Vascular/Lymphatic: There are no enlarged abdominal or pelvic lymph nodes. No significant vascular findings. Reproductive: The uterus and ovaries appear unremarkable. No adnexal mass. Other: No evidence of abdominal wall mass or hernia. No ascites. Musculoskeletal: No acute or significant osseous findings. IMPRESSION: 1. Possible mild diffuse small bowel wall thickening and fluid-filled proximal colon, suspicious for enteritis. No evidence of bowel obstruction, perforation or abscess. 2. The appendix appears normal. 3. No other significant findings. Electronically Signed   By: Richardean Sale M.D.   On: 11/16/2021 17:26    Labs: Basic Metabolic  Panel: Recent Labs  Lab 11/16/21 1432 11/18/21 1317 11/19/21 0509  NA 137 133* 133*  K 3.6 3.3* 3.3*  CL 106 98 99   CO2 22 30 25   GLUCOSE 123* 99 83  BUN 14 17 13   CREATININE 0.76 0.78 0.68  CALCIUM 9.3 9.3 8.5*   CBC: Recent Labs  Lab 11/16/21 1432 11/18/21 1317 11/19/21 0509  WBC 14.6* 11.1* 7.5  HGB 13.8 12.9 10.9*  HCT 41.7 39.0 33.1*  MCV 90.8 90.3 91.9  PLT 212 211 187   Microbiology: Results for orders placed or performed during the hospital encounter of 11/18/21  Blood culture (routine x 2)     Status: None (Preliminary result)   Collection Time: 11/18/21  3:52 PM   Specimen: BLOOD  Result Value Ref Range Status   Specimen Description BLOOD LEFT ANTECUBITAL  Final   Special Requests   Final    BOTTLES DRAWN AEROBIC AND ANAEROBIC Blood Culture adequate volume   Culture   Final    NO GROWTH 4 DAYS Performed at [facility], [address] [place], [address] [place]    Report Status PENDING  Incomplete  Gastrointestinal Panel by PCR , Stool     Status: None   Collection Time: 11/18/21  4:00 PM   Specimen: STOOL  Result Value Ref Range Status   Campylobacter species NOT DETECTED NOT DETECTED Final   Plesimonas shigelloides NOT DETECTED NOT DETECTED Final   Salmonella species NOT DETECTED NOT DETECTED Final   Yersinia enterocolitica NOT DETECTED NOT DETECTED Final   Vibrio species NOT DETECTED NOT DETECTED Final   Vibrio cholerae NOT DETECTED NOT DETECTED Final   Enteroaggregative E coli (EAEC) NOT DETECTED NOT DETECTED Final   Enteropathogenic E coli (EPEC) NOT DETECTED NOT DETECTED Final   Enterotoxigenic E coli (ETEC) NOT DETECTED NOT DETECTED Final   Shiga like toxin producing E coli (STEC) NOT DETECTED NOT DETECTED Final   Shigella/Enteroinvasive E coli (EIEC) NOT DETECTED NOT DETECTED Final   Cryptosporidium NOT DETECTED NOT DETECTED Final   Cyclospora cayetanensis NOT DETECTED NOT DETECTED Final   Entamoeba histolytica NOT DETECTED NOT DETECTED Final   Giardia lamblia NOT DETECTED NOT DETECTED Final   Adenovirus F40/41 NOT DETECTED NOT DETECTED Final    Astrovirus NOT DETECTED NOT DETECTED Final   Norovirus GI/GII NOT DETECTED NOT DETECTED Final   Rotavirus A NOT DETECTED NOT DETECTED Final   Sapovirus (I, II, IV, and V) NOT DETECTED NOT DETECTED Final    Comment: Performed at [facility], [address] [place], [address] [place]  Blood culture (routine x 2)     Status: None (Preliminary result)   Collection Time: 11/18/21  5:43 PM   Specimen: BLOOD  Result Value Ref Range Status   Specimen Description BLOOD BLOOD RIGHT HAND  Final   Special Requests   Final    BOTTLES DRAWN AEROBIC AND ANAEROBIC Blood Culture adequate volume   Culture   Final    NO GROWTH 4 DAYS Performed at [facility], [address], [place], [address] [place]    Report Status PENDING  Incomplete    Time coordinating discharge: Over 30 minutes  [place], MD  [facility] 11/22/2021, 4:17 PM

## 2021-11-20 NOTE — Progress Notes (Signed)
PROGRESS NOTE  Erin Joseph    DOB: 2000/03/14, 21 y.o.  YNW:[id number]    Code Status: Full Code   DOA: 11/18/2021   LOS: 1   Brief hospital course  Erin Joseph is a 21 y.o. female with a PMH significant for migraines.  They presented from home to the ED on 11/18/2021 with intractable N/V/abdominal pain x 3 days.  In the ED, she was found to have a fever as high as 101.1, tachycardia as high as 116 and otherwise normal vital signs. Labs were significant for negative lipase, kidney function within normal limits, Na+ 133, K+ 3.3, glucose 99, WBC 11.1, otherwise normal CBC, lactic acid, blood cultures, GI panel pending. Urinalysis positive for small hemoglobin, specific gravity 1.034 , 20 ketones, 100 protein, negative nitrates or leukocytes, rare bacteria, 11-20 squamous epithelial cells.  Picture is most consistent with dehydration.  Urine pregnancy test negative.   CT abdomen pelvis positive for signs of inflammation of the small bowel consistent with enteritis without obstruction.  Terminal ileum and appendix are read as normal.   They were initially treated with Tylenol, Reglan, LR bolus, morphine, ceftriaxone, Flagyl  Patient was admitted to medicine service for further workup and management of intractable N/V/abdominal pain as outlined in detail below.  11/20/21 -stable, unimproved, per pt  Assessment & Plan  Principal Problem:   Intractable nausea and vomiting  Intractable nausea, vomiting Enteritis-as seen on abdominal CT which is significantly worsened since prior study 2 days ago-diarrhea has resolved spontaneously at this time.  Most likely infectious etiology.  Viral versus bacterial. Hepatitis less likely given normal liver enzymes.  Less likely GU infection given recent negative STI testing and no vaginal symptoms.  Can consider IBD if symptoms do not resolve infectious agent not identified. - GI consulted, appreciate your care  - EGD planned 10/30. Will keep NPO  overnight - deescalate Abx once Bx Cx and/or GI panel indicates not necessary  - GI panel negative, acute hepatitis panel negative.  - continue supportive care including IVF -Tylenol IV, p.o. as needed -Zofran IV, p.o.  As needed -Heating pads -Monitor fever curve   H/o migraines-currently asymptomatic -Home sumatriptan ordered.  Body mass index is 32.32 kg/m.  VTE ppx: ambulatory  Diet:     Diet   Diet regular Room service appropriate? Yes; Fluid consistency: Thin   Consultants: GI Subjective 11/20/21    Pt reports mildly improved abdominal pain and more localized to inferior to umbilicus now. She states that she has had one more small BM and one episode of vomiting this morning.   Objective   Vitals:   11/19/21 0849 11/19/21 1855 11/19/21 2136 11/19/21 2237  BP: 114/61 122/75 (!) 134/98 131/72  Pulse: 91 (!) 102 (!) 119 98  Resp: 16 16 20 20   Temp: 99.5 F (37.5 C)  98.7 F (37.1 C) 99.4 F (37.4 C)  TempSrc:      SpO2: 97% 99% 95% 98%  Weight:      Height:        Intake/Output Summary (Last 24 hours) at 11/20/2021 0803 Last data filed at 11/19/2021 1835 Gross per 24 hour  Intake 1090.42 ml  Output --  Net 1090.42 ml    Filed Weights   11/18/21 1315  Weight: 77.6 kg     Physical Exam:  General: awake, alert, acute distress Respiratory: normal respiratory effort. Cardiovascular: quick capillary refill Gastrointestinal: soft, ND. No rebound or guarding but endorses diffuse pain throughout entire abdomen. Laying flat  on the bed Nervous: tearful, anxious Extremities: moves all equally, no edema, normal tone Skin: erythematous abdomen  Labs   I have personally reviewed the following labs and imaging studies CBC    Component Value Date/Time   WBC 7.5 11/19/2021 0509   RBC 3.60 (L) 11/19/2021 0509   HGB 10.9 (L) 11/19/2021 0509   HGB 13.4 03/27/2013 2224   HCT 33.1 (L) 11/19/2021 0509   HCT 38.7 03/27/2013 2224   PLT 187 11/19/2021 0509   PLT  197 03/27/2013 2224   MCV 91.9 11/19/2021 0509   MCV 86 03/27/2013 2224   MCH 30.3 11/19/2021 0509   MCHC 32.9 11/19/2021 0509   RDW 13.1 11/19/2021 0509   RDW 13.0 03/27/2013 2224   LYMPHSABS 3.1 12/03/2020 1733   LYMPHSABS 3.5 03/27/2013 2224   MONOABS 0.7 12/03/2020 1733   MONOABS 0.6 03/27/2013 2224   EOSABS 0.1 12/03/2020 1733   EOSABS 0.1 03/27/2013 2224   BASOSABS 0.1 12/03/2020 1733   BASOSABS 0.0 03/27/2013 2224      Latest Ref Rng & Units 11/19/2021    5:09 AM 11/18/2021    1:17 PM 11/16/2021    2:32 PM  BMP  Glucose 70 - 99 mg/dL 83  99  035   BUN 6 - 20 mg/dL 13  17  14    Creatinine 0.44 - 1.00 mg/dL  4.65  6.81   Sodium 135 - 145 mmol/L 133  133  137   Potassium 3.5 - 5.1 mmol/L 3.3  3.3  3.6   Chloride 98 - 111 mmol/L 99  98  106   CO2 22 - 32 mmol/L 25  30  22    Calcium 8.9 - 10.3 mg/dL 8.5  9.3  9.3     CT ABDOMEN PELVIS W CONTRAST  Result Date: 11/18/2021 CLINICAL DATA:  Abdominal pain, acute nonlocalized. EXAM: CT ABDOMEN AND PELVIS WITH CONTRAST TECHNIQUE: Multidetector CT imaging of the abdomen and pelvis was performed using the standard protocol following bolus administration of intravenous contrast. RADIATION DOSE REDUCTION: This exam was performed according to the departmental dose-optimization program which includes automated exposure control, adjustment of the mA and/or kV according to patient size and/or use of iterative reconstruction technique. CONTRAST:  OMNIPAQUE IOHEXOL 300 MG/ML  SOLN COMPARISON:  None Available. FINDINGS: Lower chest: Lung bases are clear. Hepatobiliary: No focal hepatic lesion. Postcholecystectomy. No biliary dilatation. Pancreas: Pancreas is normal. No ductal dilatation. No pancreatic inflammation. Spleen: Normal spleen Adrenals/urinary tract: Adrenal glands and kidneys are normal. The ureters and bladder normal. Stomach/Bowel: Stomach is normal volume. Duodenum normal caliber. There is fluid in the proximal small bowel  with mild dilatation up to 2.6 cm (image 41/2). There is mild mucosal enhancement (image 49/coronal/series 5. There is some edema in the mesentery. This small bowel mucosal enhancement extends in pelvis (image 75/2). The terminal ileum leading up to the ileocecal valve is reduced in caliber; however, no evidence of high-grade obstruction. Normal appendix (image 61/2). There is small amount of free fluid in the pelvis adjacent to the cecum (image 77/2). Fluid along the pericolic gutters additionally. Ascending and transverse colon normal. The colon is devoid of stool collapsed. Rectosigmoid colon normal. Intraperitoneal free fluid is small bowel inflammation or progressed from exam 2 days prior. No intraperitoneal free air.  No pneumatosis or portal venous gas. Vascular/Lymphatic: Abdominal aorta is normal caliber. No periportal or retroperitoneal adenopathy. No pelvic adenopathy. Celiac trunk and SMA are patent. Reproductive: Uterus and adnexa unremarkable. Other: No intraperitoneal  free air. Musculoskeletal: No aggressive osseous lesion. IMPRESSION: 1. Long segment fluid-filled small bowel with mucosal enhancement. Findings favor enteritis over bowel obstruction. Terminal ileum is normal. Differential for enteritis would include infectious enteritis, inflammatory bowel disease, or collagen vascular disease. Findings significantly progressed from exam 2 days prior. Recommend GI consultation. 2. Small a moderate volume of free fluid along the pericolic gutters and in the pelvis related to above described small-bowel abnormality. 3. Normal appendix. Electronically Signed   By: Genevive Bi M.D.   On: 11/18/2021 15:36   DG Abdomen 1 View  Result Date: 11/18/2021 CLINICAL DATA:  n/v eval for obstructive pattern EXAM: ABDOMEN - 1 VIEW COMPARISON:  CT 11/16/2021 FINDINGS: There is a mildly dilated small bowel loop in the lower abdomen. There is a dilated loop of bowel in the pelvis favored to be the cecum or  sigmoid colon. Right upper quadrant surgical clips. Mild stool burden. IMPRESSION: Mildly dilated loop of small bowel in the lower abdomen and the loop of bowel in the pelvis favored to be the cecum. This is nonspecific and could reflect an infectious/inflammatory process, early partial bowel obstruction or reactive ileus is possible. Electronically Signed   By: Caprice Renshaw M.D.   On: 11/18/2021 14:27    Disposition Plan & Communication  Patient status: Inpatient  Admitted From: Home Planned disposition location: Home Anticipated discharge date: 10/30 pending clinical improvement  Family Communication: mother on phone with pt at time of encounter    Author: Leeroy Bock, DO [facility] 11/20/2021, 8:03 AM   Available by Epic secure chat 7AM-7PM. If 7PM-7AM, please contact night-coverage.  [facility] contact information found on [email or web address].

## 2021-11-21 ENCOUNTER — Encounter
Admission: EM | Disposition: A | Discharge status: Home / Self Care | Payer: Self-pay | Source: Home / Self Care | Attending: Student in an Organized Health Care Education/Training Program

## 2021-11-21 ENCOUNTER — Inpatient Hospital Stay: Payer: BC Managed Care – PPO | Admitting: Anesthesiology

## 2021-11-21 ENCOUNTER — Encounter: Payer: Self-pay | Admitting: Student in an Organized Health Care Education/Training Program

## 2021-11-21 DIAGNOSIS — R112 Nausea with vomiting, unspecified: Secondary | ICD-10-CM | POA: Diagnosis not present

## 2021-11-21 DIAGNOSIS — K529 Noninfective gastroenteritis and colitis, unspecified: Secondary | ICD-10-CM | POA: Diagnosis not present

## 2021-11-21 DIAGNOSIS — R1084 Generalized abdominal pain: Secondary | ICD-10-CM | POA: Diagnosis not present

## 2021-11-21 HISTORY — PX: ESOPHAGOGASTRODUODENOSCOPY (EGD) WITH PROPOFOL: SHX5813

## 2021-11-21 SURGERY — ESOPHAGOGASTRODUODENOSCOPY (EGD) WITH PROPOFOL
Anesthesia: General

## 2021-11-21 MED ORDER — RISAQUAD PO CAPS: 2.0000 | ORAL_CAPSULE | Freq: Every day | ORAL | Status: AC

## 2021-11-21 MED ORDER — METAMUCIL SMOOTH TEXTURE 58.6 % PO POWD: 1.0000 | Freq: Three times a day (TID) | ORAL | 12 refills | Status: AC

## 2021-11-21 MED ORDER — POLYETHYLENE GLYCOL 3350 17 GM/SCOOP PO POWD: 17.0000 g | Freq: Every day | ORAL | 0 refills | Status: AC

## 2021-11-21 MED ORDER — PROPOFOL 10 MG/ML IV BOLUS
INTRAVENOUS | Status: DC | PRN
  Administered 2021-11-21: 90 mg via INTRAVENOUS

## 2021-11-21 MED ORDER — PANTOPRAZOLE SODIUM 20 MG PO TBEC: 20.0000 mg | DELAYED_RELEASE_TABLET | Freq: Every day | ORAL | 0 refills | Status: AC

## 2021-11-21 MED ORDER — PROPOFOL 500 MG/50ML IV EMUL
INTRAVENOUS | Status: DC | PRN
  Administered 2021-11-21: 200 ug/kg/min via INTRAVENOUS

## 2021-11-21 MED ORDER — ONDANSETRON HCL 4 MG PO TABS: 4.0000 mg | ORAL_TABLET | Freq: Three times a day (TID) | ORAL | 0 refills | Status: AC | PRN

## 2021-11-21 MED ORDER — SORBITOL 70 % SOLN
960.0000 mL | TOPICAL_OIL | Freq: Once | ORAL | Status: AC
  Administered 2021-11-21: 960 mL via RECTAL
  Filled 2021-11-21 (×2): qty 240

## 2021-11-21 MED ORDER — DEXMEDETOMIDINE HCL IN NACL 200 MCG/50ML IV SOLN
INTRAVENOUS | Status: DC | PRN
  Administered 2021-11-21: 12 ug via INTRAVENOUS

## 2021-11-21 MED ORDER — LIDOCAINE HCL (CARDIAC) PF 100 MG/5ML IV SOSY
PREFILLED_SYRINGE | INTRAVENOUS | Status: DC | PRN
  Administered 2021-11-21: 40 mg via INTRAVENOUS

## 2021-11-21 NOTE — Progress Notes (Signed)
PROGRESS NOTE  Erin Joseph    DOB: 12-15-2000, 21 y.o.  VOJ:[id number]    Code Status: Full Code   DOA: 11/18/2021   LOS: 2   Brief hospital course  Erin Joseph is a 21 y.o. female with a PMH significant for migraines.  They presented from home to the ED on 11/18/2021 with intractable N/V/abdominal pain x 3 days.  In the ED, she was found to have a fever as high as 101.1, tachycardia as high as 116 and otherwise normal vital signs. Labs were significant for negative lipase, kidney function within normal limits, Na+ 133, K+ 3.3, glucose 99, WBC 11.1, otherwise normal CBC, lactic acid, blood cultures, GI panel pending. Urinalysis positive for small hemoglobin, specific gravity 1.034 , 20 ketones, 100 protein, negative nitrates or leukocytes, rare bacteria, 11-20 squamous epithelial cells.  Picture is most consistent with dehydration.  Urine pregnancy test negative.   CT abdomen pelvis positive for signs of inflammation of the small bowel consistent with enteritis without obstruction.  Terminal ileum and appendix are read as normal.   They were initially treated with Tylenol, Reglan, LR bolus, morphine, ceftriaxone, Flagyl  Patient was admitted to medicine service for further workup and management of intractable N/V/abdominal pain as outlined in detail below.  11/21/21 -stable, improving, per pt. Continues to be intolerant of PO trials. 2 days since last BM. Discussed the side effects of narcotics contributing.   Assessment & Plan  Principal Problem:   Intractable nausea and vomiting  Intractable nausea, vomiting Enteritis-as seen on abdominal CT which is significantly worsened since prior study 2 days ago-diarrhea has resolved spontaneously at this time.  Most likely infectious etiology.  Less likely GU infection given recent negative STI testing and no vaginal symptoms. Can consider IBD if symptoms do not resolve infectious agent not identified. Still not tolerating PO. Endorsing  constipation.  - GI consulted, appreciate your care  - EGD 10/30. Biopsies taken - deescalate Abx once Bx Cx and/or GI panel indicates not necessary  - GI panel negative, acute hepatitis panel negative.  - continue supportive care including IVF -Tylenol IV, p.o. as needed -Zofran IV, p.o.  As needed -Heating pads -Monitor fever curve - bowel regimen including SMOG enema   H/o migraines-currently asymptomatic -Home sumatriptan ordered.  Body mass index is 32.32 kg/m.  VTE ppx: ambulatory  Diet:     Diet   Diet Heart Room service appropriate? Yes; Fluid consistency: Thin   Consultants: GI Subjective 11/21/21    Pt reports improved abdominal pain. Endorses constipation. Has not tolerated PO since EGD   Objective   Vitals:   11/21/21 0819 11/21/21 1007 11/21/21 1159 11/21/21 1328  BP: (!) 138/90 (!) 143/85 112/61 139/70  Pulse: 81 75 91 74  Resp:  18 (!) 23 17  Temp: 98.4 F (36.9 C) 98.7 F (37.1 C) 98.5 F (36.9 C) 99 F (37.2 C)  TempSrc:  Temporal Temporal Oral  SpO2: 99% 100% 100% 100%  Weight:  77.6 kg    Height:  5\' 1"  (1.549 m)     No intake or output data in the 24 hours ending 11/21/21 1658  Filed Weights   11/18/21 1315 11/21/21 1007  Weight: 77.6 kg 77.6 kg     Physical Exam:  General: awake, alert, no acute distress Respiratory: normal respiratory effort. Cardiovascular: quick capillary refill Gastrointestinal: soft, ND. No rebound or guarding but endorses diffuse pain throughout entire abdomen. Sitting comfortably up in the bed Nervous: alert, calm Extremities:  moves all equally, no edema, normal tone Skin: erythematous abdomen  Labs   I have personally reviewed the following labs and imaging studies CBC    Component Value Date/Time   WBC 7.5 11/19/2021 0509   RBC 3.60 (L) 11/19/2021 0509   HGB 10.9 (L) 11/19/2021 0509   HGB 13.4 03/27/2013 2224   HCT 33.1 (L) 11/19/2021 0509   HCT 38.7 03/27/2013 2224   PLT 187 11/19/2021 0509    PLT 197 03/27/2013 2224   MCV 91.9 11/19/2021 0509   MCV 86 03/27/2013 2224   MCH 30.3 11/19/2021 0509   MCHC 32.9 11/19/2021 0509   RDW 13.1 11/19/2021 0509   RDW 13.0 03/27/2013 2224   LYMPHSABS 3.1 12/03/2020 1733   LYMPHSABS 3.5 03/27/2013 2224   MONOABS 0.7 12/03/2020 1733   MONOABS 0.6 03/27/2013 2224   EOSABS 0.1 12/03/2020 1733   EOSABS 0.1 03/27/2013 2224   BASOSABS 0.1 12/03/2020 1733   BASOSABS 0.0 03/27/2013 2224      Latest Ref Rng & Units 11/19/2021    5:09 AM 11/18/2021    1:17 PM 11/16/2021    2:32 PM  BMP  Glucose 70 - 99 mg/dL 83  99  123   BUN 6 - 20 mg/dL 13  17  14    Creatinine 0.44 - 1.00 mg/dL 0.68  0.78  0.76   Sodium 135 - 145 mmol/L 133  133  137   Potassium 3.5 - 5.1 mmol/L 3.3  3.3  3.6   Chloride 98 - 111 mmol/L 99  98  106   CO2 22 - 32 mmol/L 25  30  22    Calcium 8.9 - 10.3 mg/dL 8.5  9.3  9.3     No results found.  Disposition Plan & Communication  Patient status: Inpatient  Admitted From: Home Planned disposition location: Home Anticipated discharge date: 10/31 pending clinical improvement  Family Communication: mother on phone with pt at time of encounter. Dad and grandmother in room   Author: Richarda Osmond, DO [facility] 11/21/2021, 4:58 PM   Available by Epic secure chat 7AM-7PM. If 7PM-7AM, please contact night-coverage.  [facility] contact information found on [email or web address].

## 2021-11-21 NOTE — TOC Initial Note (Signed)
Transition of Care [facility]) - Initial/Assessment Note    Patient Details  Name: Erin Joseph MRN: [id number] Date of Birth: 2000-02-17  [facility]) CM/SW Contact:    Laurena Slimmer, RN Phone Number: 11/21/2021, 2:09 PM  Clinical Narrative:                         Patient Goals and CMS Choice        Expected Discharge Plan and Services                                                Prior Living Arrangements/Services                       Activities of Daily Living Home Assistive Devices/Equipment: None ADL Screening (condition at time of admission) Patient's cognitive ability adequate to safely complete daily activities?: Yes Is the patient deaf or have difficulty hearing?: No Does the patient have difficulty seeing, even when wearing glasses/contacts?: No Does the patient have difficulty concentrating, remembering, or making decisions?: No Patient able to express need for assistance with ADLs?: No Does the patient have difficulty dressing or bathing?: No Independently performs ADLs?: Yes (appropriate for developmental age) Does the patient have difficulty walking or climbing stairs?: No Weakness of Legs: None Weakness of Arms/Hands: None  Permission Sought/Granted                  Emotional Assessment              Admission diagnosis:  Enteritis [K52.9] Generalized abdominal pain [R10.84] Intractable nausea and vomiting [R11.2] Nausea and vomiting, unspecified vomiting type [R11.2] Patient Active Problem List   Diagnosis Date Noted   Intractable nausea and vomiting 11/18/2021   Enteritis    Generalized abdominal pain    Nausea and vomiting    Dyspareunia in female 08/25/2019   Migraines 07/31/2017   Intractable migraine with aura without status migrainosus 09/19/2016   Dysmenorrhea in the adolescent 09/19/2016   Pelvic pain 09/19/2016   PCP:  System, Provider Not In Pharmacy:   [id number] [address], [place] [address] [place] [address] Phone: [phone] Fax: [phone]     Social Determinants of Health (SDOH) Interventions    Readmission Risk Interventions     No data to display

## 2021-11-21 NOTE — Anesthesia Preprocedure Evaluation (Addendum)
Anesthesia Evaluation  Patient identified by MRN, date of birth, ID band Patient awake    Reviewed: Allergy & Precautions, NPO status , Patient's Chart, lab work & pertinent test results  History of Anesthesia Complications Negative for: history of anesthetic complications  Airway Mallampati: I   Neck ROM: Full    Dental  (+) Chipped   Pulmonary  Current vaping   Pulmonary exam normal breath sounds clear to auscultation       Cardiovascular Exercise Tolerance: Good negative cardio ROS Normal cardiovascular exam Rhythm:Regular Rate:Normal  ECG 11/18/21:  Sinus tachycardia (HR 103) Otherwise normal ECG   Neuro/Psych  Headaches, PSYCHIATRIC DISORDERS (PTSD) Anxiety Depression    GI/Hepatic negative GI ROS,   Endo/Other  Obesity   Renal/GU negative Renal ROS     Musculoskeletal   Abdominal   Peds  Hematology negative hematology ROS (+)   Anesthesia Other Findings   Reproductive/Obstetrics                            Anesthesia Physical Anesthesia Plan  ASA: 2  Anesthesia Plan: General   Post-op Pain Management:    Induction: Intravenous  PONV Risk Score and Plan: 3 and Propofol infusion, TIVA and Treatment may vary due to age or medical condition  Airway Management Planned: Natural Airway  Additional Equipment:   Intra-op Plan:   Post-operative Plan:   Informed Consent: I have reviewed the patients History and Physical, chart, labs and discussed the procedure including the risks, benefits and alternatives for the proposed anesthesia with the patient or authorized representative who has indicated his/her understanding and acceptance.       Plan Discussed with: CRNA  Anesthesia Plan Comments: (LMA/GETA backup discussed.  Patient consented for risks of anesthesia including but not limited to:  - adverse reactions to medications - damage to eyes, teeth, lips or other oral  mucosa - nerve damage due to positioning  - sore throat or hoarseness - damage to heart, brain, nerves, lungs, other parts of body or loss of life  Informed patient about role of CRNA in peri- and intra-operative care.  Patient voiced understanding.)        Anesthesia Quick Evaluation

## 2021-11-21 NOTE — Plan of Care (Signed)

## 2021-11-21 NOTE — Plan of Care (Signed)
  Problem: Health Behavior/Discharge Planning: Goal: Ability to manage health-related needs will improve 11/21/2021 1653 by Abel Ra A, LPN Outcome: Progressing 11/21/2021 1642 by Haille Pardi A, LPN Outcome: Progressing   Problem: Clinical Measurements: Goal: Will remain free from infection 11/21/2021 1653 by Izaiyah Kleinman A, LPN Outcome: Progressing 11/21/2021 1642 by Jayah Balthazar A, LPN Outcome: Progressing   Problem: Clinical Measurements: Goal: Cardiovascular complication will be avoided 11/21/2021 1653 by Penina Reisner A, LPN Outcome: Progressing 11/21/2021 1642 by Jontavia Leatherbury A, LPN Outcome: Progressing   Problem: Elimination: Goal: Will not experience complications related to bowel motility 11/21/2021 1653 by Zekiel Torian A, LPN Outcome: Progressing 11/21/2021 1642 by Elleah Hemsley A, LPN Outcome: Progressing

## 2021-11-21 NOTE — Discharge Instructions (Signed)
Follow up with your primary care doctor in 1-2 weeks to review the biopsy results from your EGD.  Avoid any NSAID medications (ibuprofen, advil, alieve, motrin), caffeine, or alcohol as this can irritate your stomach lining Taking daily probiotic and fiber supplements.  Avoid greasy and fried food Try chamomile, peppermint, lemon-ginger teas to help aid in digestion

## 2021-11-21 NOTE — Anesthesia Postprocedure Evaluation (Signed)
Anesthesia Post Note  Patient: Erin Joseph  Procedure(s) Performed: ESOPHAGOGASTRODUODENOSCOPY (EGD) WITH PROPOFOL  Patient location during evaluation: PACU Anesthesia Type: General Level of consciousness: awake and alert, oriented and patient cooperative Pain management: pain level controlled Vital Signs Assessment: post-procedure vital signs reviewed and stable Respiratory status: spontaneous breathing, nonlabored ventilation and respiratory function stable Cardiovascular status: blood pressure returned to baseline and stable Postop Assessment: adequate PO intake Anesthetic complications: no   No notable events documented.   Last Vitals:  Vitals:   11/21/21 1007 11/21/21 1159  BP: (!) 143/85 112/61  Pulse: 75 91  Resp: 18 (!) 23  Temp: 37.1 C 36.9 C  SpO2: 100% 100%    Last Pain:  Vitals:   11/21/21 1212  TempSrc:   PainSc: 0-No pain                 Darrin Nipper

## 2021-11-21 NOTE — Op Note (Signed)
[facility] Patient Name: Erin [facility] Procedure Date: 11/21/2021 11:27 AM MRN: [id number] Account #: [id number] Date of Birth: 2000-10-04 Admit Type: Inpatient Age: 21 Room: [facility] ENDO ROOM 1 Gender: Female Note Status: Finalized Instrument Name: Upper Endoscope [id number] Procedure:             Upper GI endoscopy Indications:           Abdominal pain Providers:             Wyline Mood MD, MD Referring MD:          No Local Md, MD (Referring MD) Medicines:             Propofol per Anesthesia, Monitored Anesthesia Care Complications:         No immediate complications. Procedure:             Pre-Anesthesia Assessment:                        - Prior to the procedure, a History and Physical was                         performed, and patient medications, allergies and                         sensitivities were reviewed. The patient's tolerance                         of previous anesthesia was reviewed.                        - The risks and benefits of the procedure and the                         sedation options and risks were discussed with the                         patient. All questions were answered and informed                         consent was obtained.                        - ASA Grade Assessment: II - A patient with mild                         systemic disease.                        After obtaining informed consent, the endoscope was                         passed under direct vision. Throughout the procedure,                         the patient's blood pressure, pulse, and oxygen                         saturations were monitored continuously. The Endoscope  was introduced through the mouth, and advanced to the                         third part of duodenum. The upper GI endoscopy was                         technically difficult and complex due to the patient's                         agitation. The patient  tolerated the procedure well. Findings:      The esophagus was normal.      The stomach was normal.      Bilious fluid was found on the greater curvature of the stomach.      Three non-bleeding superficial duodenal ulcers with no stigmata of       bleeding were found in the duodenal bulb. The largest lesion was 5 mm in       largest dimension.      Diffuse mildly erythematous mucosa without active bleeding and with no       stigmata of bleeding was found in the duodenal bulb. Biopsies were taken       with a cold forceps for histology.      The cardia and gastric fundus were normal on retroflexion. Impression:            - Normal esophagus.                        - Normal stomach.                        - Bilious gastric fluid.                        - Non-bleeding duodenal ulcers with no stigmata of                         bleeding.                        - Erythematous duodenopathy. Biopsied. Recommendation:        - Return patient to hospital ward for ongoing care.                        - Advance diet as tolerated.                        - No ibuprofen, naproxen, or other non-steroidal                         anti-inflammatory drugs.                        - Use Prilosec (omeprazole) 40 mg PO daily for 3                         months.                        - Await pathology results.                        -  Return to GI office PRN. Procedure Code(s):     --- Professional ---                        [phone], Esophagogastroduodenoscopy, flexible,                         transoral; with biopsy, single or multiple Diagnosis Code(s):     --- Professional ---                        K26.9, Duodenal ulcer, unspecified as acute or                         chronic, without hemorrhage or perforation                        K31.89, Other diseases of stomach and duodenum                        R10.9, Unspecified abdominal pain CPT copyright 2022 American Medical Association. All rights  reserved. The codes documented in this report are preliminary and upon coder review may  be revised to meet current compliance requirements. Wyline Mood, MD Wyline Mood MD, MD 11/21/2021 11:57:15 AM This report has been signed electronically. Number of Addenda: 0 Note Initiated On: 11/21/2021 11:27 AM Estimated Blood Loss:  Estimated blood loss: none.      [facility]

## 2021-11-21 NOTE — Plan of Care (Signed)
  Problem: Clinical Measurements: Goal: Diagnostic test results will improve Outcome: Progressing   Problem: Activity: Goal: Risk for activity intolerance will decrease Outcome: Progressing   Problem: Coping: Goal: Level of anxiety will decrease Outcome: Progressing   Problem: Pain Managment: Goal: General experience of comfort will improve Outcome: Progressing   Problem: Skin Integrity: Goal: Risk for impaired skin integrity will decrease Outcome: Progressing

## 2021-11-21 NOTE — Transfer of Care (Signed)
Immediate Anesthesia Transfer of Care Note  Patient: Erin Joseph  Procedure(s) Performed: Procedure(s): ESOPHAGOGASTRODUODENOSCOPY (EGD) WITH PROPOFOL (N/A)  Patient Location: PACU and Endoscopy Unit  Anesthesia Type:General  Level of Consciousness: sedated  Airway & Oxygen Therapy: Patient Spontanous Breathing and Patient connected to nasal cannula oxygen  Post-op Assessment: Report given to RN and Post -op Vital signs reviewed and stable  Post vital signs: Reviewed and stable  Last Vitals:  Vitals:   11/21/21 1007 11/21/21 1159  BP: (!) 143/85 112/61  Pulse: 75 91  Resp: 18 (!) 23  Temp: 37.1 C   SpO2: 381% 771%    Complications: No apparent anesthesia complications

## 2021-11-21 NOTE — H&P (Signed)
Wyline Mood, MD [address], [place], [place], [address] [address], [place], [place], [address] Phone: [phone]  Fax: [phone]  Primary Care Physician:  System, Provider Not In   Pre-Procedure History & Physical: HPI:  Erin Joseph is a 21 y.o. female is here for an endoscopy    Past Medical History:  Diagnosis Date   Anxiety    Depression    Dysmenorrhea in adolescent    Gastritis    Migraines    Pelvic pain    Vaccine for human papilloma virus (HPV) types 6, 11, 16, and 18 administered     Past Surgical History:  Procedure Laterality Date   CHOLECYSTECTOMY      Prior to Admission medications   Medication Sig Start Date End Date Taking? Authorizing Provider  dicyclomine (BENTYL) 10 MG capsule Take 1 capsule (10 mg total) by mouth in the morning, at noon, and at bedtime. 11/16/21  Yes Cuthriell, Delorise Royals, PA-C  famotidine (PEPCID) 20 MG tablet Take 1 tablet (20 mg total) by mouth daily. 11/16/21 11/16/22 Yes Cuthriell, Delorise Royals, PA-C  HYDROcodone-acetaminophen (NORCO/VICODIN) 5-325 MG tablet Take 1 tablet by mouth every 4 (four) hours as needed for moderate pain. 11/16/21 11/16/22 Yes Cuthriell, Delorise Royals, PA-C  lithium 600 MG capsule Take 600 mg by mouth daily. 11/14/21  Yes [provider]  QUEtiapine (SEROQUEL) 25 MG tablet Take 1 tablet (25 mg total) by mouth at bedtime. 04/05/20 11/18/21 Yes Ward, Tylene Fantasia, PA-C  acetaminophen (TYLENOL) 325 MG tablet Take 650 mg by mouth every 6 (six) hours as needed for mild pain or moderate pain.    [provider]  albuterol (VENTOLIN HFA) 108 (90 Base) MCG/ACT inhaler Inhale 1-2 puffs into the lungs every 4 (four) hours as needed for wheezing or shortness of breath. 11/29/20   Domenick Gong, MD  clonazePAM (KLONOPIN) 0.25 MG disintegrating tablet Take by mouth. Patient not taking: Reported on 11/18/2021    [provider]  lamoTRIgine (LAMICTAL) 100 MG tablet  Take 1 tablet (100 mg total) by mouth daily. Take along with 200mg  tablet daily for a total of 300mg  04/05/20 05/05/20  Ward, 04/07/20, PA-C  lamoTRIgine (LAMICTAL) 200 MG tablet Take 1 tablet (200 mg total) by mouth daily. Take along with 100mg  tablet daily for a total of 300mg  04/05/20 05/05/20  Ward, , PA-C  medroxyPROGESTERone Acetate 150 MG/ML SUSY Inject 1 mL (150 mg total) into the muscle once for 1 dose. 08/25/19 08/25/19  Copland, 05/07/20 B, PA-C  ondansetron (ZOFRAN-ODT) 4 MG disintegrating tablet Take 1 tablet (4 mg total) by mouth every 8 (eight) hours as needed. 11/16/21   Cuthriell, 10/25/19, PA-C  predniSONE (STERAPRED UNI-PAK 21 TAB) 10 MG (21) TBPK tablet Take by mouth daily. Take 6 tabs by mouth daily  for 2 days, then 5 tabs for 2 days, then 4 tabs for 2 days, then 3 tabs for 2 days, 2 tabs for 2 days, then 1 tab by mouth daily for 2 days Patient not taking: Reported on 11/18/2021 02/07/21   11/18/21, MD  Rimegepant Sulfate 75 MG TBDP Take by mouth. Patient not taking: Reported on 11/18/2021 08/04/19   [provider]  Spacer/Aero-Holding Chambers (AEROCHAMBER PLUS) inhaler Use with inhaler 11/29/20   Merrilee Jansky, MD    Allergies as of 11/18/2021   (No Known Allergies)    Family History  Problem Relation Age of Onset   Hypertension Mother  Diabetes Mother        prediabetic   Heart failure Father    Endometriosis Maternal Grandmother    Migraines Paternal Grandmother    Breast cancer Neg Hx    Ovarian cancer Neg Hx     Social History   Socioeconomic History   Marital status: Single    Spouse name: Not on file   Number of children: Not on file   Years of education: Not on file   Highest education level: Not on file  Occupational History   Not on file  Tobacco Use   Smoking status: Never   Smokeless tobacco: Never  Vaping Use   Vaping Use: Every day  Substance and Sexual Activity   Alcohol use: Yes    Comment: rarely   Drug use:  Yes    Types: Marijuana   Sexual activity: Yes    Birth control/protection: Injection  Other Topics Concern   Not on file  Social History Narrative   Not on file   Social Determinants of Health   Financial Resource Strain: Not on file  Food Insecurity: No Food Insecurity (11/18/2021)   Hunger Vital Sign    Worried About Running Out of Food in the Last Year: Never true    Ran Out of Food in the Last Year: Never true  Transportation Needs: No Transportation Needs (11/18/2021)   PRAPARE - Hydrologist (Medical): No    Lack of Transportation (Non-Medical): No  Physical Activity: Not on file  Stress: Not on file  Social Connections: Not on file  Intimate Partner Violence: Not At Risk (11/18/2021)   Humiliation, Afraid, Rape, and Kick questionnaire    Fear of Current or Ex-Partner: No    Emotionally Abused: No    Physically Abused: No    Sexually Abused: No    Review of Systems: See HPI, otherwise negative ROS  Physical Exam: BP (!) 143/85   Pulse 75   Temp 98.7 F (37.1 C) (Temporal)   Resp 18   Ht 5\' 1"  (1.549 m)   Wt 77.6 kg   SpO2 100%   BMI 32.32 kg/m  General:   Alert,  pleasant and cooperative in NAD Head:  Normocephalic and atraumatic. Neck:  Supple; no masses or thyromegaly. Lungs:  Clear throughout to auscultation, normal respiratory effort.    Heart:  +S1, +S2, Regular rate and rhythm, No edema. Abdomen:  Soft, nontender and nondistended. Normal bowel sounds, without guarding, and without rebound.   Neurologic:  Alert and  oriented x4;  grossly normal neurologically.  Impression/Plan: Erin Joseph is here for an endoscopy  to be performed for  evaluation of nausea and vomiting    Risks, benefits, limitations, and alternatives regarding endoscopy have been reviewed with the patient.  Questions have been answered.  All parties agreeable.   Jonathon Bellows, MD  11/21/2021, 11:29 AM

## 2021-11-21 NOTE — Consult Note (Signed)
Midge Minium, MD [facility]  [address], [address] [place], [place] [address] Phone: [phone] Fax : [phone]  Consultation  Referring Provider:     Dr.  Dareen Piano Primary Care Physician:  System, Provider Not In Primary Gastroenterologist: Gentry Fitz         Reason for Consultation:     Nausea and vomiting with abdominal pain  Date of Admission:  11/18/2021 Date of Consultation: 11/20/2021         HPI:   Erin Joseph is a 21 y.o. female who was admitted to the hospital with abdominal pain and nausea vomiting.  The patient states that prior to admission she woke up in the middle night with severe abdominal pain with subsequent vomiting.  The patient tried to go back to sleep but continued to have vomiting and belly pain.  The patient came to the emergency department was sent home and then came back subsequently with worsening pain.  The patient then had a CT scan that showed possible enteritis.  The patient reports that she has a sensitive stomach and had her gallbladder out when she was 14.  She also reports that she used to vomit before school every day from nerves.  She has not had these episodes of vomiting from nerves recently.  There was no report of any fevers chills black stools or bloody stools.  She did report a loose bowel movement since admission. Admission the patient also had some tachycardia with a temperature of 101.  The patient's white cell count was elevated slightly at 11.  Due to the patient not improving a GI consult was called.  The patient does report that she ate 3 full meals yesterday and had eaten some fruit prior to me coming in today.  Past Medical History:  Diagnosis Date  . Anxiety   . Depression   . Dysmenorrhea in adolescent   . Gastritis   . Migraines   . Pelvic pain   . Vaccine for human papilloma virus (HPV) types 6, 11, 16, and 18 administered     Past Surgical History:  Procedure Laterality Date  . CHOLECYSTECTOMY      Prior to Admission  medications   Medication Sig Start Date End Date Taking? Authorizing Provider  dicyclomine (BENTYL) 10 MG capsule Take 1 capsule (10 mg total) by mouth in the morning, at noon, and at bedtime. 11/16/21  Yes Cuthriell, Delorise Royals, PA-C  famotidine (PEPCID) 20 MG tablet Take 1 tablet (20 mg total) by mouth daily. 11/16/21 11/16/22 Yes Cuthriell, Delorise Royals, PA-C  HYDROcodone-acetaminophen (NORCO/VICODIN) 5-325 MG tablet Take 1 tablet by mouth every 4 (four) hours as needed for moderate pain. 11/16/21 11/16/22 Yes Cuthriell, Delorise Royals, PA-C  lithium 600 MG capsule Take 600 mg by mouth daily. 11/14/21  Yes [provider]  QUEtiapine (SEROQUEL) 25 MG tablet Take 1 tablet (25 mg total) by mouth at bedtime. 04/05/20 11/18/21 Yes Ward, Tylene Fantasia, PA-C  acetaminophen (TYLENOL) 325 MG tablet Take 650 mg by mouth every 6 (six) hours as needed for mild pain or moderate pain.    [provider]  albuterol (VENTOLIN HFA) 108 (90 Base) MCG/ACT inhaler Inhale 1-2 puffs into the lungs every 4 (four) hours as needed for wheezing or shortness of breath. 11/29/20   Domenick Gong, MD  clonazePAM (KLONOPIN) 0.25 MG disintegrating tablet Take by mouth. Patient not taking: Reported on 11/18/2021    [provider]  lamoTRIgine (LAMICTAL) 100 MG tablet Take 1 tablet (100 mg  total) by mouth daily. Take along with 200mg  tablet daily for a total of 300mg  04/05/20 05/05/20  Ward, 04/07/20, PA-C  lamoTRIgine (LAMICTAL) 200 MG tablet Take 1 tablet (200 mg total) by mouth daily. Take along with 100mg  tablet daily for a total of 300mg  04/05/20 05/05/20  Ward, , PA-C  medroxyPROGESTERone Acetate 150 MG/ML SUSY Inject 1 mL (150 mg total) into the muscle once for 1 dose. 08/25/19 08/25/19  Copland, 05/07/20 B, PA-C  ondansetron (ZOFRAN-ODT) 4 MG disintegrating tablet Take 1 tablet (4 mg total) by mouth every 8 (eight) hours as needed. 11/16/21   Cuthriell, 10/25/19, PA-C  predniSONE (STERAPRED UNI-PAK 21  TAB) 10 MG (21) TBPK tablet Take by mouth daily. Take 6 tabs by mouth daily  for 2 days, then 5 tabs for 2 days, then 4 tabs for 2 days, then 3 tabs for 2 days, 2 tabs for 2 days, then 1 tab by mouth daily for 2 days Patient not taking: Reported on 11/18/2021 02/07/21   11/18/21, MD  Rimegepant Sulfate 75 MG TBDP Take by mouth. Patient not taking: Reported on 11/18/2021 08/04/19   [provider]  Spacer/Aero-Holding Chambers (AEROCHAMBER PLUS) inhaler Use with inhaler 11/29/20   Merrilee Jansky, MD    Family History  Problem Relation Age of Onset  . Hypertension Mother   . Diabetes Mother        prediabetic  . Heart failure Father   . Endometriosis Maternal Grandmother   . Migraines Paternal Grandmother   . Breast cancer Neg Hx   . Ovarian cancer Neg Hx      Social History   Tobacco Use  . Smoking status: Never  . Smokeless tobacco: Never  Vaping Use  . Vaping Use: Never used  Substance Use Topics  . Alcohol use: No  . Drug use: Yes    Types: Marijuana    Allergies as of 11/18/2021  . (No Known Allergies)    Review of Systems:    All systems reviewed and negative except where noted in HPI.   Physical Exam:  Vital signs in last 24 hours: Temp:  [98.6 F (37 C)-100.1 F (37.8 C)] 98.6 F (37 C) (10/30 0004) Pulse Rate:  [77-92] 81 (10/30 0004) Resp:  [16] 16 (10/30 0004) BP: (112-126)/(63-84) 126/78 (10/30 0004) SpO2:  [98 %-100 %] 98 % (10/30 0004) Last BM Date : 11/19/21 General:   Pleasant, cooperative in NAD Head:  Normocephalic and atraumatic. Eyes:   No icterus.   Conjunctiva pink. PERRLA. Ears:  Normal auditory acuity. Neck:  Supple; no masses or thyroidomegaly Lungs: Respirations even and unlabored. Lungs clear to auscultation bilaterally.   No wheezes, crackles, or rhonchi.  Heart:  Regular rate and rhythm;  Without murmur, clicks, rubs or gallops Abdomen:  Soft, nondistended, positive tenderness to 1 finger palpation while flexing the  abdominal wall muscles by lifting the patient's both legs 6 inches above the bed while keeping them straight. Normal bowel sounds. No appreciable masses or hepatomegaly.  No rebound or guarding.  Rectal:  Not performed. Msk:  Symmetrical without gross deformities.    Extremities:  Without edema, cyanosis or clubbing. Neurologic:  Alert and oriented x3;  grossly normal neurologically. Skin:  Intact without significant lesions or rashes. Cervical Nodes:  No significant cervical adenopathy. Psych:  Alert and cooperative. Normal affect.  LAB RESULTS: Recent Labs    11/18/21 1317 11/19/21 0509  WBC 11.1* 7.5  HGB 12.9 10.9*  HCT 39.0 33.1*  PLT 211 187   BMET Recent Labs    11/18/21 1317 11/19/21 0509  NA 133* 133*  K 3.3* 3.3*  CL 98 99  CO2 30 25  GLUCOSE 99 83  BUN 17 13  CREATININE 0.78 0.68  CALCIUM 9.3 8.5*   LFT Recent Labs    11/19/21 0509  PROT 6.7  ALBUMIN 2.7*  AST 12*  ALT 13  ALKPHOS 44  BILITOT 1.2   PT/INR No results for input(s): "LABPROT", "INR" in the last 72 hours.  STUDIES: No results found.    Impression / Plan:   Assessment: Principal Problem:   Intractable nausea and vomiting   Erin Joseph is a 21 y.o. y/o female with with abdominal pain that is clearly musculoskeletal and reproducible with muscle flexion.  The patient's nausea and vomiting has improved since she has been admitted.  The patient has a history of chronic heartburn and states that when she vomits acid comes up.  The patient's previous gastroenterologist did not want to keep her on her PPI therefore she has been on no medication for her acid reflux.  Plan:  The patient has been told about the musculoskeletal pain and to use warm compresses on her abdomen and avoid further abdominal wall straining.  The patient will also be set up for an EGD with Dr. Vicente Males on Monday for recurrent nausea and vomiting and reflux.  The patient has been told the risks and benefits of a PPI and  she may need long-term PPI treatment.  The patient and her father have been explained the plan and agree with it.  Thank you for involving me in the care of this patient.      LOS: 2 days   Lucilla Lame, MD, [facility] 11/21/2021, 7:13 AM,  Pager [phone] 7am-5pm  Check AMION for 5pm -7am coverage and on weekends   Note: This dictation was prepared with Dragon dictation along with smaller phrase technology. Any transcriptional errors that result from this process are unintentional.

## 2021-11-22 ENCOUNTER — Encounter: Payer: Self-pay | Admitting: Gastroenterology

## 2021-11-22 LAB — SURGICAL PATHOLOGY

## 2021-11-22 NOTE — Plan of Care (Signed)

## 2021-11-22 NOTE — Plan of Care (Signed)
Patient discharged per MD orders at this time.All discharge instructions,education and medications reviewed with the patient.Pt expressed understanding and will comply with dc instructions.follow up appointments was also communicated to the patient.no verbal c/o or any ssx of distress.Pt was transported home by family in a privately owned vehicle.

## 2021-11-23 LAB — CULTURE, BLOOD (ROUTINE X 2)
Culture: NO GROWTH
Culture: NO GROWTH
Special Requests: ADEQUATE
Special Requests: ADEQUATE
